# Patient Record
Sex: Female | Born: 1960
Health system: Southern US, Community
[De-identification: ages and names within clinical notes are randomized; demographics above are authoritative.]

## PROBLEM LIST (undated history)

## (undated) DIAGNOSIS — E785 Hyperlipidemia, unspecified: Secondary | ICD-10-CM

## (undated) DIAGNOSIS — K8020 Calculus of gallbladder without cholecystitis without obstruction: Secondary | ICD-10-CM

## (undated) DIAGNOSIS — L659 Nonscarring hair loss, unspecified: Secondary | ICD-10-CM

## (undated) DIAGNOSIS — E039 Hypothyroidism, unspecified: Secondary | ICD-10-CM

## (undated) DIAGNOSIS — F17200 Nicotine dependence, unspecified, uncomplicated: Secondary | ICD-10-CM

## (undated) DIAGNOSIS — G4700 Insomnia, unspecified: Secondary | ICD-10-CM

## (undated) DIAGNOSIS — M25559 Pain in unspecified hip: Secondary | ICD-10-CM

## (undated) DIAGNOSIS — I739 Peripheral vascular disease, unspecified: Secondary | ICD-10-CM

## (undated) DIAGNOSIS — R079 Chest pain, unspecified: Secondary | ICD-10-CM

## (undated) DIAGNOSIS — E079 Disorder of thyroid, unspecified: Secondary | ICD-10-CM

## (undated) DIAGNOSIS — M545 Low back pain, unspecified: Secondary | ICD-10-CM

## (undated) DIAGNOSIS — J301 Allergic rhinitis due to pollen: Secondary | ICD-10-CM

## (undated) DIAGNOSIS — T50905A Adverse effect of unspecified drugs, medicaments and biological substances, initial encounter: Secondary | ICD-10-CM

## (undated) DIAGNOSIS — T7840XA Allergy, unspecified, initial encounter: Secondary | ICD-10-CM

## (undated) DIAGNOSIS — E669 Obesity, unspecified: Secondary | ICD-10-CM

## (undated) DIAGNOSIS — E876 Hypokalemia: Secondary | ICD-10-CM

## (undated) DIAGNOSIS — E069 Thyroiditis, unspecified: Secondary | ICD-10-CM

## (undated) HISTORY — DX: Calculus of gallbladder without cholecystitis without obstruction: K80.20

## (undated) HISTORY — DX: Insomnia, unspecified: G47.00

## (undated) HISTORY — DX: Allergic rhinitis due to pollen: J30.1

## (undated) HISTORY — DX: Peripheral vascular disease, unspecified: I73.9

## (undated) HISTORY — DX: Hypokalemia: E87.6

## (undated) HISTORY — DX: Allergy, unspecified, initial encounter: T78.40XA

## (undated) HISTORY — DX: Low back pain, unspecified: M54.50

## (undated) HISTORY — DX: Hypothyroidism, unspecified: E03.9

## (undated) HISTORY — DX: Obesity, unspecified: E66.9

## (undated) HISTORY — DX: Chest pain, unspecified: R07.9

## (undated) HISTORY — DX: Adverse effect of unspecified drugs, medicaments and biological substances, initial encounter: T50.905A

## (undated) HISTORY — DX: Thyroiditis, unspecified: E06.9

## (undated) HISTORY — DX: Nonscarring hair loss, unspecified: L65.9

## (undated) HISTORY — DX: Hyperlipidemia, unspecified: E78.5

## (undated) HISTORY — PX: REVISION TOTAL HIP ARTHROPLASTY: SHX766

## (undated) HISTORY — DX: Nicotine dependence, unspecified, uncomplicated: F17.200

## (undated) HISTORY — DX: Pain in unspecified hip: M25.559

## (undated) HISTORY — PX: TUBAL LIGATION: SHX77

---
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
## 2004-11-04 ENCOUNTER — Ambulatory Visit (HOSPITAL_COMMUNITY): Admission: RE | Admit: 2004-11-04 | Discharge: 2004-11-04 | Payer: Self-pay | Admitting: Pulmonary Disease

## 2005-05-02 ENCOUNTER — Emergency Department (HOSPITAL_COMMUNITY): Admission: EM | Admit: 2005-05-02 | Discharge: 2005-05-02 | Payer: Self-pay | Admitting: Emergency Medicine

## 2008-03-06 ENCOUNTER — Ambulatory Visit: Payer: Self-pay | Admitting: Orthopedic Surgery

## 2008-03-06 DIAGNOSIS — M545 Low back pain, unspecified: Secondary | ICD-10-CM | POA: Insufficient documentation

## 2008-03-06 DIAGNOSIS — IMO0002 Reserved for concepts with insufficient information to code with codable children: Secondary | ICD-10-CM

## 2008-03-06 DIAGNOSIS — M169 Osteoarthritis of hip, unspecified: Secondary | ICD-10-CM

## 2008-03-06 DIAGNOSIS — M1610 Unilateral primary osteoarthritis, unspecified hip: Secondary | ICD-10-CM | POA: Insufficient documentation

## 2008-03-08 ENCOUNTER — Encounter: Payer: Self-pay | Admitting: Orthopedic Surgery

## 2008-03-26 ENCOUNTER — Telehealth: Payer: Self-pay | Admitting: Orthopedic Surgery

## 2008-04-09 ENCOUNTER — Telehealth: Payer: Self-pay | Admitting: Orthopedic Surgery

## 2008-04-12 ENCOUNTER — Encounter: Payer: Self-pay | Admitting: Orthopedic Surgery

## 2008-05-06 ENCOUNTER — Encounter: Payer: Self-pay | Admitting: Orthopedic Surgery

## 2014-03-27 ENCOUNTER — Other Ambulatory Visit (HOSPITAL_COMMUNITY): Payer: Self-pay | Admitting: Pulmonary Disease

## 2014-03-27 DIAGNOSIS — Z139 Encounter for screening, unspecified: Secondary | ICD-10-CM

## 2014-03-27 DIAGNOSIS — E039 Hypothyroidism, unspecified: Secondary | ICD-10-CM | POA: Diagnosis not present

## 2014-03-27 DIAGNOSIS — M1990 Unspecified osteoarthritis, unspecified site: Secondary | ICD-10-CM | POA: Diagnosis not present

## 2014-04-03 ENCOUNTER — Ambulatory Visit (HOSPITAL_COMMUNITY)
Admission: RE | Admit: 2014-04-03 | Discharge: 2014-04-03 | Disposition: A | Discharge status: Home / Self Care | Payer: Medicare Other | Source: Ambulatory Visit | Attending: Pulmonary Disease | Admitting: Pulmonary Disease

## 2014-04-03 ENCOUNTER — Other Ambulatory Visit (HOSPITAL_COMMUNITY): Payer: Self-pay | Admitting: Pulmonary Disease

## 2014-04-03 DIAGNOSIS — Z1231 Encounter for screening mammogram for malignant neoplasm of breast: Secondary | ICD-10-CM | POA: Insufficient documentation

## 2014-04-03 DIAGNOSIS — Z139 Encounter for screening, unspecified: Secondary | ICD-10-CM

## 2014-04-03 LAB — HM MAMMOGRAPHY

## 2014-09-26 DIAGNOSIS — J309 Allergic rhinitis, unspecified: Secondary | ICD-10-CM | POA: Diagnosis not present

## 2014-09-26 DIAGNOSIS — E039 Hypothyroidism, unspecified: Secondary | ICD-10-CM | POA: Diagnosis not present

## 2015-03-25 DIAGNOSIS — E039 Hypothyroidism, unspecified: Secondary | ICD-10-CM | POA: Diagnosis not present

## 2015-03-25 DIAGNOSIS — L659 Nonscarring hair loss, unspecified: Secondary | ICD-10-CM | POA: Diagnosis not present

## 2015-03-25 DIAGNOSIS — F17200 Nicotine dependence, unspecified, uncomplicated: Secondary | ICD-10-CM | POA: Diagnosis not present

## 2015-03-25 LAB — HM HEPATITIS C SCREENING LAB: HM Hepatitis Screen: NEGATIVE

## 2015-09-22 DIAGNOSIS — E039 Hypothyroidism, unspecified: Secondary | ICD-10-CM | POA: Diagnosis not present

## 2015-09-22 DIAGNOSIS — F17200 Nicotine dependence, unspecified, uncomplicated: Secondary | ICD-10-CM | POA: Diagnosis not present

## 2015-11-27 DIAGNOSIS — E039 Hypothyroidism, unspecified: Secondary | ICD-10-CM | POA: Diagnosis not present

## 2015-11-27 DIAGNOSIS — F17200 Nicotine dependence, unspecified, uncomplicated: Secondary | ICD-10-CM | POA: Diagnosis not present

## 2016-03-29 DIAGNOSIS — Z0000 Encounter for general adult medical examination without abnormal findings: Secondary | ICD-10-CM | POA: Diagnosis not present

## 2016-03-29 DIAGNOSIS — Z23 Encounter for immunization: Secondary | ICD-10-CM | POA: Diagnosis not present

## 2016-04-01 DIAGNOSIS — Z0000 Encounter for general adult medical examination without abnormal findings: Secondary | ICD-10-CM | POA: Diagnosis not present

## 2016-04-01 DIAGNOSIS — E039 Hypothyroidism, unspecified: Secondary | ICD-10-CM | POA: Diagnosis not present

## 2016-04-01 DIAGNOSIS — F17200 Nicotine dependence, unspecified, uncomplicated: Secondary | ICD-10-CM | POA: Diagnosis not present

## 2016-04-01 DIAGNOSIS — L659 Nonscarring hair loss, unspecified: Secondary | ICD-10-CM | POA: Diagnosis not present

## 2016-04-05 LAB — FECAL OCCULT BLOOD, GUAIAC: Fecal Occult Blood: NEGATIVE

## 2016-04-06 DIAGNOSIS — Z1211 Encounter for screening for malignant neoplasm of colon: Secondary | ICD-10-CM | POA: Diagnosis not present

## 2016-09-22 DIAGNOSIS — E785 Hyperlipidemia, unspecified: Secondary | ICD-10-CM | POA: Diagnosis not present

## 2016-09-22 DIAGNOSIS — E039 Hypothyroidism, unspecified: Secondary | ICD-10-CM | POA: Diagnosis not present

## 2016-09-22 DIAGNOSIS — E669 Obesity, unspecified: Secondary | ICD-10-CM | POA: Diagnosis not present

## 2016-09-22 DIAGNOSIS — F17200 Nicotine dependence, unspecified, uncomplicated: Secondary | ICD-10-CM | POA: Diagnosis not present

## 2017-01-24 DIAGNOSIS — E785 Hyperlipidemia, unspecified: Secondary | ICD-10-CM | POA: Diagnosis not present

## 2017-01-24 DIAGNOSIS — E039 Hypothyroidism, unspecified: Secondary | ICD-10-CM | POA: Diagnosis not present

## 2017-01-24 DIAGNOSIS — F17200 Nicotine dependence, unspecified, uncomplicated: Secondary | ICD-10-CM | POA: Diagnosis not present

## 2017-01-24 DIAGNOSIS — G4700 Insomnia, unspecified: Secondary | ICD-10-CM | POA: Diagnosis not present

## 2017-04-14 ENCOUNTER — Other Ambulatory Visit: Payer: Self-pay

## 2017-04-14 ENCOUNTER — Emergency Department (HOSPITAL_COMMUNITY): Payer: Medicare HMO

## 2017-04-14 ENCOUNTER — Emergency Department (HOSPITAL_COMMUNITY)
Admission: EM | Admit: 2017-04-14 | Discharge: 2017-04-15 | Disposition: A | Discharge status: Home / Self Care | Payer: Medicare HMO | Attending: Emergency Medicine | Admitting: Emergency Medicine

## 2017-04-14 ENCOUNTER — Encounter (HOSPITAL_COMMUNITY): Payer: Self-pay | Admitting: *Deleted

## 2017-04-14 DIAGNOSIS — E876 Hypokalemia: Secondary | ICD-10-CM | POA: Diagnosis not present

## 2017-04-14 DIAGNOSIS — Z87891 Personal history of nicotine dependence: Secondary | ICD-10-CM | POA: Insufficient documentation

## 2017-04-14 DIAGNOSIS — R0789 Other chest pain: Secondary | ICD-10-CM | POA: Diagnosis not present

## 2017-04-14 DIAGNOSIS — E785 Hyperlipidemia, unspecified: Secondary | ICD-10-CM | POA: Insufficient documentation

## 2017-04-14 DIAGNOSIS — R079 Chest pain, unspecified: Secondary | ICD-10-CM | POA: Diagnosis present

## 2017-04-14 DIAGNOSIS — R001 Bradycardia, unspecified: Secondary | ICD-10-CM | POA: Diagnosis not present

## 2017-04-14 DIAGNOSIS — J4521 Mild intermittent asthma with (acute) exacerbation: Secondary | ICD-10-CM | POA: Insufficient documentation

## 2017-04-14 DIAGNOSIS — R0602 Shortness of breath: Secondary | ICD-10-CM | POA: Diagnosis not present

## 2017-04-14 HISTORY — DX: Hyperlipidemia, unspecified: E78.5

## 2017-04-14 HISTORY — DX: Disorder of thyroid, unspecified: E07.9

## 2017-04-14 LAB — MAGNESIUM: MAGNESIUM: 2 mg/dL (ref 1.7–2.4)

## 2017-04-14 LAB — BASIC METABOLIC PANEL
ANION GAP: 12 (ref 5–15)
BUN: 7 mg/dL (ref 6–20)
CALCIUM: 9.2 mg/dL (ref 8.9–10.3)
CO2: 22 mmol/L (ref 22–32)
CREATININE: 1.07 mg/dL — AB (ref 0.44–1.00)
Chloride: 104 mmol/L (ref 101–111)
GFR calc Af Amer: >60 mL/min (ref >60)
GFR, EST NON AFRICAN AMERICAN: 57 mL/min — AB (ref >60)
GLUCOSE: 89 mg/dL (ref 65–99)
Potassium: 3.1 mmol/L — ABNORMAL LOW (ref 3.5–5.1)
Sodium: 138 mmol/L (ref 135–145)

## 2017-04-14 LAB — CBC
HCT: 40.7 % (ref 36.0–46.0)
HEMOGLOBIN: 13.6 g/dL (ref 12.0–15.0)
MCH: 31.3 pg (ref 26.0–34.0)
MCHC: 33.4 g/dL (ref 30.0–36.0)
MCV: 93.8 fL (ref 78.0–100.0)
PLATELETS: 218 10*3/uL (ref 150–400)
RBC: 4.34 MIL/uL (ref 3.87–5.11)
RDW: 15.1 % (ref 11.5–15.5)
WBC: 7.6 10*3/uL (ref 4.0–10.5)

## 2017-04-14 LAB — TROPONIN I

## 2017-04-14 MED ORDER — POTASSIUM CHLORIDE CRYS ER 20 MEQ PO TBCR
40.0000 meq | EXTENDED_RELEASE_TABLET | Freq: Once | ORAL | Status: AC
Start: 2017-04-14 — End: 2017-04-14
  Administered 2017-04-14: 40 meq via ORAL
  Filled 2017-04-14: qty 2

## 2017-04-14 MED ORDER — IPRATROPIUM-ALBUTEROL 0.5-2.5 (3) MG/3ML IN SOLN
3.0000 mL | Freq: Once | RESPIRATORY_TRACT | Status: AC
  Administered 2017-04-14: 3 mL via RESPIRATORY_TRACT
  Filled 2017-04-14: qty 3

## 2017-04-14 MED ORDER — METHYLPREDNISOLONE SODIUM SUCC 125 MG IJ SOLR
125.0000 mg | Freq: Once | INTRAMUSCULAR | Status: AC
  Administered 2017-04-14: 125 mg via INTRAVENOUS
  Filled 2017-04-14: qty 2

## 2017-04-14 MED ORDER — KETOROLAC TROMETHAMINE 30 MG/ML IJ SOLN
30.0000 mg | Freq: Once | INTRAMUSCULAR | Status: AC
  Administered 2017-04-14: 30 mg via INTRAVENOUS
  Filled 2017-04-14: qty 1

## 2017-04-14 NOTE — ED Provider Notes (Signed)
[facility] DEPT Provider Note   CSN: [id number] Arrival date & time: 04/14/17  1634     History   Chief Complaint Chief Complaint  Patient presents with  . Chest Pain    HPI Misty Parker is a 56 y.o. female.  Pt presents to the ED today with chest pain and sob.  Pt said sob started a few days ago.  She initially thought it may be allergies, but it did not improve.  The cp started today.  She describes it as feeling tight.  Pain radiates up to her left neck.  Pt denies f/c.      Past Medical History:  Diagnosis Date  . Hyperlipidemia   . Thyroid disease     Patient Active Problem List   Diagnosis Date Noted  . HIP, ARTHRITIS, DEGEN./OSTEO 03/06/2008  . DISC DEGENERATION 03/06/2008  . LOW BACK PAIN 03/06/2008    Past Surgical History:  Procedure Laterality Date  . REVISION TOTAL HIP ARTHROPLASTY      OB History    No data available       Home Medications    Prior to Admission medications   Medication Sig Start Date End Date Taking? Authorizing Provider  potassium chloride (K-DUR) 10 MEQ tablet Take 1 tablet (10 mEq total) by mouth daily. 04/15/17   Jacalyn Lefevre, MD  predniSONE (STERAPRED UNI-PAK 21 TAB) 10 MG (21) TBPK tablet Take 6 tabs by mouth daily  for 2 days, then 5 tabs for 2 days, then 4 tabs for 2 days, then 3 tabs for 2 days, 2 tabs for 2 days, then 1 tab by mouth daily for 2 days 04/15/17   Jacalyn Lefevre, MD    Family History No family history on file.  Social History Social History  Substance Use Topics  . Smoking status: Former Games developer  . Smokeless tobacco: Never Used  . Alcohol use No     Allergies   Patient has no known allergies.   Review of Systems Review of Systems  Respiratory: Positive for shortness of breath.   Cardiovascular: Positive for chest pain.  All other systems reviewed and are negative.    Physical Exam Updated Vital Signs BP (!) 125/57   Pulse (!) 42   Temp 98.2 F (36.8 C) (Oral)   Resp 10    Ht  (1.651 m)   Wt 86.2 kg (190 lb)   SpO2 99%   BMI 31.62 kg/m   Physical Exam  Constitutional: She is oriented to person, place, and time. She appears well-developed and well-nourished.  HENT:  Head: Normocephalic and atraumatic.  Right Ear: External ear normal.  Left Ear: External ear normal.  Nose: Nose normal.  Mouth/Throat: Oropharynx is clear and moist.  Eyes: Pupils are equal, round, and reactive to light. Conjunctivae and EOM are normal.  Neck: Normal range of motion. Neck supple.  Cardiovascular: Normal rate, regular rhythm, normal heart sounds and intact distal pulses.   Pulmonary/Chest: She has wheezes.  Abdominal: Soft. Bowel sounds are normal.  Musculoskeletal: Normal range of motion.  Neurological: She is alert and oriented to person, place, and time.  Skin: Skin is warm and dry.  Psychiatric: She has a normal mood and affect. Her behavior is normal. Thought content normal.  Nursing note and vitals reviewed.    ED Treatments / Results  Labs (all labs ordered are listed, but only abnormal results are displayed) Labs Reviewed  BASIC METABOLIC PANEL - Abnormal; Notable for the following:  Result Value   Potassium 3.1 (*)    Creatinine, Ser 1.07 (*)    GFR calc non Af Amer 57 (*)    All other components within normal limits  CBC  TROPONIN I  MAGNESIUM  I-STAT TROPONIN, ED    EKG  EKG Interpretation  Date/Time:  Thursday April 14 2017 16:41:39 EDT Ventricular Rate:  55 PR Interval:  190 QRS Duration: 82 QT Interval:  406 QTC Calculation: 388 R Axis:   46 Text Interpretation:  Sinus bradycardia ST & T wave abnormality, consider anterolateral ischemia Abnormal ECG No old tracing to compare Confirmed by [place], Doreatha Martin [phone]) on 04/14/2017 5:45:16 PM       Radiology Dg Chest 2 View  Result Date: 04/14/2017 CLINICAL DATA:  Tightness in chest started today-- SOB x 3 days worsening Former smoker EXAM: CHEST  2 VIEW COMPARISON:  None.  FINDINGS: The heart size and mediastinal contours are within normal limits. Both lungs are clear. The visualized skeletal structures are unremarkable. IMPRESSION: No active cardiopulmonary disease. Electronically Signed   By: Norva Pavlov M.D.   On: 04/14/2017 17:13    Procedures Procedures (including critical care time)  Medications Ordered in ED Medications  albuterol (PROVENTIL HFA;VENTOLIN HFA) 108 (90 Base) MCG/ACT inhaler 1-2 puff (not administered)  AEROCHAMBER PLUS FLO-VU MEDIUM MISC 1 each (not administered)  ipratropium-albuterol (DUONEB) 0.5-2.5 (3) MG/3ML nebulizer solution 3 mL (3 mLs Nebulization Given 04/14/17 2202)  methylPREDNISolone sodium succinate (SOLU-MEDROL) 125 mg/2 mL injection 125 mg (125 mg Intravenous Given 04/14/17 2252)  potassium chloride SA (K-DUR,KLOR-CON) CR tablet 40 mEq (40 mEq Oral Given 04/14/17 2252)  ketorolac (TORADOL) 30 MG/ML injection 30 mg (30 mg Intravenous Given 04/14/17 2252)     Initial Impression / Assessment and Plan / ED Course  I have reviewed the triage vital signs and the nursing notes.  Pertinent labs & imaging results that were available during my care of the patient were reviewed by me and considered in my medical decision making (see chart for details).    Pt is feeling better.  She is given potassium here.  She is given an inhaler with spacer for home.  She is bradycardic here, but does not look symptomatic.  She knows to return if worse.  F/u with pcp.   Final Clinical Impressions(s) / ED Diagnoses   Final diagnoses:  Atypical chest pain  Mild intermittent reactive airway disease with acute exacerbation  Hypokalemia    New Prescriptions New Prescriptions   POTASSIUM CHLORIDE (K-DUR) 10 MEQ TABLET    Take 1 tablet (10 mEq total) by mouth daily.   PREDNISONE (STERAPRED UNI-PAK 21 TAB) 10 MG (21) TBPK TABLET    Take 6 tabs by mouth daily  for 2 days, then 5 tabs for 2 days, then 4 tabs for 2 days, then 3 tabs for 2 days, 2  tabs for 2 days, then 1 tab by mouth daily for 2 days     Jacalyn Lefevre, MD 04/15/17 0013

## 2017-04-14 NOTE — ED Triage Notes (Signed)
Pt come in with chest pain starting 4 days ago. Pt states she has had worsening chest pain and shortness of breath. Pt has tingling feeling around her mouth. Pt is alert and oriented. NAD noted.

## 2017-04-14 NOTE — ED Notes (Signed)
Patient heart rate on monitor was 39, reported to nurse RN Micheal. I did a repeat EKG on patient and gave to nurse. Patient states that she feels funny.States that she had some numbness in arm and tingling around her mouth.

## 2017-04-15 LAB — POCT I-STAT TROPONIN I: TROPONIN I, POC: 0 ng/mL (ref 0.00–0.08)

## 2017-04-15 MED ORDER — POTASSIUM CHLORIDE ER 10 MEQ PO TBCR: 10.0000 meq | EXTENDED_RELEASE_TABLET | Freq: Every day | ORAL | 0 refills | Status: DC

## 2017-04-15 MED ORDER — AEROCHAMBER PLUS FLO-VU MEDIUM MISC
1.0000 | Freq: Once | Status: AC
  Administered 2017-04-15: 1
  Filled 2017-04-15 (×2): qty 1

## 2017-04-15 MED ORDER — PREDNISONE 10 MG (21) PO TBPK: ORAL_TABLET | ORAL | 0 refills | Status: DC

## 2017-04-15 MED ORDER — ALBUTEROL SULFATE HFA 108 (90 BASE) MCG/ACT IN AERS
1.0000 | INHALATION_SPRAY | RESPIRATORY_TRACT | Status: DC | PRN
  Administered 2017-04-15: 2 via RESPIRATORY_TRACT
  Filled 2017-04-15 (×2): qty 6.7

## 2017-04-19 DIAGNOSIS — E039 Hypothyroidism, unspecified: Secondary | ICD-10-CM | POA: Diagnosis not present

## 2017-04-19 DIAGNOSIS — E876 Hypokalemia: Secondary | ICD-10-CM | POA: Diagnosis not present

## 2017-04-19 DIAGNOSIS — R079 Chest pain, unspecified: Secondary | ICD-10-CM | POA: Diagnosis not present

## 2017-04-19 DIAGNOSIS — F17200 Nicotine dependence, unspecified, uncomplicated: Secondary | ICD-10-CM | POA: Diagnosis not present

## 2017-04-20 ENCOUNTER — Other Ambulatory Visit (HOSPITAL_COMMUNITY): Payer: Self-pay | Admitting: Pulmonary Disease

## 2017-04-20 DIAGNOSIS — R109 Unspecified abdominal pain: Secondary | ICD-10-CM

## 2017-04-27 ENCOUNTER — Ambulatory Visit (HOSPITAL_COMMUNITY)
Admission: RE | Admit: 2017-04-27 | Discharge: 2017-04-27 | Disposition: A | Discharge status: Home / Self Care | Payer: Medicare HMO | Source: Ambulatory Visit | Attending: Pulmonary Disease | Admitting: Pulmonary Disease

## 2017-04-27 DIAGNOSIS — K8020 Calculus of gallbladder without cholecystitis without obstruction: Secondary | ICD-10-CM | POA: Insufficient documentation

## 2017-04-27 DIAGNOSIS — R109 Unspecified abdominal pain: Secondary | ICD-10-CM | POA: Diagnosis not present

## 2017-04-27 DIAGNOSIS — I700 Atherosclerosis of aorta: Secondary | ICD-10-CM | POA: Insufficient documentation

## 2017-04-27 DIAGNOSIS — K760 Fatty (change of) liver, not elsewhere classified: Secondary | ICD-10-CM | POA: Diagnosis not present

## 2017-04-28 DIAGNOSIS — E039 Hypothyroidism, unspecified: Secondary | ICD-10-CM | POA: Diagnosis not present

## 2017-04-28 DIAGNOSIS — Z23 Encounter for immunization: Secondary | ICD-10-CM | POA: Diagnosis not present

## 2017-04-28 DIAGNOSIS — I739 Peripheral vascular disease, unspecified: Secondary | ICD-10-CM | POA: Diagnosis not present

## 2017-04-28 DIAGNOSIS — E785 Hyperlipidemia, unspecified: Secondary | ICD-10-CM | POA: Diagnosis not present

## 2017-04-28 DIAGNOSIS — F17200 Nicotine dependence, unspecified, uncomplicated: Secondary | ICD-10-CM | POA: Diagnosis not present

## 2017-05-12 ENCOUNTER — Encounter: Payer: Self-pay | Admitting: General Surgery

## 2017-05-12 ENCOUNTER — Ambulatory Visit (INDEPENDENT_AMBULATORY_CARE_PROVIDER_SITE_OTHER): Payer: Medicare HMO | Admitting: General Surgery

## 2017-05-12 VITALS — BP 138/78 | HR 75 | Temp 99.1°F | Resp 18 | Ht 65.0 in | Wt 186.0 lb

## 2017-05-12 DIAGNOSIS — K8020 Calculus of gallbladder without cholecystitis without obstruction: Secondary | ICD-10-CM | POA: Diagnosis not present

## 2017-05-12 NOTE — H&P (Signed)
Misty Parker; [id number]; May 09, 1961   HPI Patient is a 56 year old white female who was referred to my care by Dr. Juanetta GoslingHawkins for evaluation and treatment of gallstones.  Patient states that she has had right upper quadrant abdominal pain with nausea and bloating over the past 4 weeks.  She has tried to control her symptoms with diet.  She denies any fever, chills, or jaundice.  She was found on ultrasound the gallbladder to have cholelithiasis.  She currently has 0 pain.  She does have nausea with fatty foods.  When she does have right upper quadrant abdominal pain, radiates around the right leg to the back. Past Medical History:  Diagnosis Date  . Hyperlipidemia   . Thyroid disease     Past Surgical History:  Procedure Laterality Date  . REVISION TOTAL HIP ARTHROPLASTY      No family history on file.  Current Outpatient Prescriptions on File Prior to Visit  Medication Sig Dispense Refill  . potassium chloride (K-DUR) 10 MEQ tablet Take 1 tablet (10 mEq total) by mouth daily. 7 tablet 0  . predniSONE (STERAPRED UNI-PAK 21 TAB) 10 MG (21) TBPK tablet Take 6 tabs by mouth daily  for 2 days, then 5 tabs for 2 days, then 4 tabs for 2 days, then 3 tabs for 2 days, 2 tabs for 2 days, then 1 tab by mouth daily for 2 days 42 tablet 0   No current facility-administered medications on file prior to visit.     No Known Allergies  History  Alcohol Use No    History  Smoking Status  . Former Smoker  Smokeless Tobacco  . Never Used    Review of Systems  Constitutional: Positive for malaise/fatigue.  HENT: Negative.   Eyes: Negative.   Respiratory: Negative.   Cardiovascular: Negative.   Gastrointestinal: Negative.   Genitourinary: Negative.   Musculoskeletal: Positive for back pain.  Skin: Negative.   Neurological: Negative.   Endo/Heme/Allergies: Negative.   Psychiatric/Behavioral: Negative.     Objective   Vitals:   05/12/17 0941  BP: 138/78  Pulse: 75  Resp: 18   Temp: 99.1 F (37.3 C)    Physical Exam  Constitutional: She is oriented to person, place, and time and well-developed, well-nourished, and in no distress.  HENT:  Head: Normocephalic and atraumatic.  Eyes: No scleral icterus.  Cardiovascular: Normal rate, regular rhythm and normal heart sounds.  Exam reveals no gallop and no friction rub.   No murmur heard. Pulmonary/Chest: Effort normal and breath sounds normal. No respiratory distress. She has no wheezes. She has no rales.  Abdominal: Soft. Bowel sounds are normal. She exhibits no distension. There is tenderness. There is no rebound and no guarding.  Slight tenderness in the right upper quadrant to palpation.  No rigidity noted.  Neurological: She is alert and oriented to person, place, and time.  Skin: Skin is warm and dry.  Vitals reviewed.  Ultrasound report reviewed. Assessment  Biliary colic, cholelithiasis Plan   Patient is scheduled for laparoscopic cholecystectomy on 05/20/2017.  The risks and benefits of the procedure including bleeding, infection, hepatobiliary injury, and the possibility of an open procedure were fully explained to the patient, who gave informed consent.

## 2017-05-12 NOTE — Progress Notes (Signed)
Misty Parker; [id number]; 06/05/1961   HPI Patient is a 56-year-old white female who was referred to my care by Dr. Hawkins for evaluation and treatment of gallstones.  Patient states that she has had right upper quadrant abdominal pain with nausea and bloating over the past 4 weeks.  She has tried to control her symptoms with diet.  She denies any fever, chills, or jaundice.  She was found on ultrasound the gallbladder to have cholelithiasis.  She currently has 0 pain.  She does have nausea with fatty foods.  When she does have right upper quadrant abdominal pain, radiates around the right leg to the back. Past Medical History:  Diagnosis Date  . Hyperlipidemia   . Thyroid disease     Past Surgical History:  Procedure Laterality Date  . REVISION TOTAL HIP ARTHROPLASTY      No family history on file.  Current Outpatient Prescriptions on File Prior to Visit  Medication Sig Dispense Refill  . potassium chloride (K-DUR) 10 MEQ tablet Take 1 tablet (10 mEq total) by mouth daily. 7 tablet 0  . predniSONE (STERAPRED UNI-PAK 21 TAB) 10 MG (21) TBPK tablet Take 6 tabs by mouth daily  for 2 days, then 5 tabs for 2 days, then 4 tabs for 2 days, then 3 tabs for 2 days, 2 tabs for 2 days, then 1 tab by mouth daily for 2 days 42 tablet 0   No current facility-administered medications on file prior to visit.     No Known Allergies  History  Alcohol Use No    History  Smoking Status  . Former Smoker  Smokeless Tobacco  . Never Used    Review of Systems  Constitutional: Positive for malaise/fatigue.  HENT: Negative.   Eyes: Negative.   Respiratory: Negative.   Cardiovascular: Negative.   Gastrointestinal: Negative.   Genitourinary: Negative.   Musculoskeletal: Positive for back pain.  Skin: Negative.   Neurological: Negative.   Endo/Heme/Allergies: Negative.   Psychiatric/Behavioral: Negative.     Objective   Vitals:   05/12/17 0941  BP: 138/78  Pulse: 75  Resp: 18   Temp: 99.1 F (37.3 C)    Physical Exam  Constitutional: She is oriented to person, place, and time and well-developed, well-nourished, and in no distress.  HENT:  Head: Normocephalic and atraumatic.  Eyes: No scleral icterus.  Cardiovascular: Normal rate, regular rhythm and normal heart sounds.  Exam reveals no gallop and no friction rub.   No murmur heard. Pulmonary/Chest: Effort normal and breath sounds normal. No respiratory distress. She has no wheezes. She has no rales.  Abdominal: Soft. Bowel sounds are normal. She exhibits no distension. There is tenderness. There is no rebound and no guarding.  Slight tenderness in the right upper quadrant to palpation.  No rigidity noted.  Neurological: She is alert and oriented to person, place, and time.  Skin: Skin is warm and dry.  Vitals reviewed.  Ultrasound report reviewed. Assessment  Biliary colic, cholelithiasis Plan   Patient is scheduled for laparoscopic cholecystectomy on 05/20/2017.  The risks and benefits of the procedure including bleeding, infection, hepatobiliary injury, and the possibility of an open procedure were fully explained to the patient, who gave informed consent.  

## 2017-05-12 NOTE — Patient Instructions (Signed)

## 2017-05-17 ENCOUNTER — Encounter (HOSPITAL_COMMUNITY)
Admission: RE | Admit: 2017-05-17 | Discharge: 2017-05-17 | Disposition: A | Discharge status: Home / Self Care | Payer: Medicare HMO | Source: Ambulatory Visit | Attending: General Surgery | Admitting: General Surgery

## 2017-05-17 ENCOUNTER — Encounter (HOSPITAL_COMMUNITY): Payer: Self-pay

## 2017-05-17 ENCOUNTER — Other Ambulatory Visit: Payer: Self-pay

## 2017-05-17 DIAGNOSIS — Z96649 Presence of unspecified artificial hip joint: Secondary | ICD-10-CM | POA: Diagnosis not present

## 2017-05-17 DIAGNOSIS — K8070 Calculus of gallbladder and bile duct without cholecystitis without obstruction: Secondary | ICD-10-CM | POA: Diagnosis present

## 2017-05-17 DIAGNOSIS — E079 Disorder of thyroid, unspecified: Secondary | ICD-10-CM | POA: Diagnosis not present

## 2017-05-17 DIAGNOSIS — E785 Hyperlipidemia, unspecified: Secondary | ICD-10-CM | POA: Diagnosis not present

## 2017-05-17 DIAGNOSIS — Z87891 Personal history of nicotine dependence: Secondary | ICD-10-CM | POA: Diagnosis not present

## 2017-05-17 DIAGNOSIS — K8064 Calculus of gallbladder and bile duct with chronic cholecystitis without obstruction: Secondary | ICD-10-CM | POA: Diagnosis not present

## 2017-05-17 HISTORY — DX: Hypothyroidism, unspecified: E03.9

## 2017-05-17 LAB — COMPREHENSIVE METABOLIC PANEL
ALBUMIN: 3.9 g/dL (ref 3.5–5.0)
ALK PHOS: 73 U/L (ref 38–126)
ALT: 20 U/L (ref 14–54)
ANION GAP: 13 (ref 5–15)
AST: 25 U/L (ref 15–41)
BILIRUBIN TOTAL: 0.7 mg/dL (ref 0.3–1.2)
BUN: 7 mg/dL (ref 6–20)
CALCIUM: 9.3 mg/dL (ref 8.9–10.3)
CO2: 23 mmol/L (ref 22–32)
Chloride: 103 mmol/L (ref 101–111)
Creatinine, Ser: 0.85 mg/dL (ref 0.44–1.00)
Glucose, Bld: 102 mg/dL — ABNORMAL HIGH (ref 65–99)
POTASSIUM: 4 mmol/L (ref 3.5–5.1)
Sodium: 139 mmol/L (ref 135–145)
Total Protein: 7.3 g/dL (ref 6.5–8.1)

## 2017-05-17 LAB — CBC WITH DIFFERENTIAL/PLATELET
BASOS ABS: 0 10*3/uL (ref 0.0–0.1)
BASOS PCT: 0 %
Eosinophils Absolute: 0.1 10*3/uL (ref 0.0–0.7)
Eosinophils Relative: 2 %
HEMATOCRIT: 40.5 % (ref 36.0–46.0)
HEMOGLOBIN: 12.9 g/dL (ref 12.0–15.0)
LYMPHS PCT: 31 %
Lymphs Abs: 2.8 10*3/uL (ref 0.7–4.0)
MCH: 30.6 pg (ref 26.0–34.0)
MCHC: 31.9 g/dL (ref 30.0–36.0)
MCV: 96.2 fL (ref 78.0–100.0)
MONO ABS: 0.8 10*3/uL (ref 0.1–1.0)
Monocytes Relative: 9 %
NEUTROS ABS: 5.1 10*3/uL (ref 1.7–7.7)
NEUTROS PCT: 58 %
Platelets: 349 10*3/uL (ref 150–400)
RBC: 4.21 MIL/uL (ref 3.87–5.11)
RDW: 16 % — AB (ref 11.5–15.5)
WBC: 8.8 10*3/uL (ref 4.0–10.5)

## 2017-05-17 NOTE — Patient Instructions (Signed)
Misty Parker  05/17/2017     @PREFPERIOPPHARMACY @   Your procedure is scheduled on  05/20/2017   [facility]  [facility]   A.M.  Call this number if you have problems the morning of surgery:  [phone]   Remember:  Do not eat food or drink liquids after midnight.  Take these medicines the morning of surgery with A SIP OF WATER None   Do not wear jewelry, make-up or nail polish.  Do not wear lotions, powders, or perfumes, or deoderant.  Do not shave 48 hours prior to surgery.  Men may shave face and neck.  Do not bring valuables to the hospital.  [facility] is not responsible for any belongings or valuables.  Contacts, dentures or bridgework may not be worn into surgery.  Leave your suitcase in the car.  After surgery it may be brought to your room.  For patients admitted to the hospital, discharge time will be determined by your treatment team.  Patients discharged the day of surgery will not be allowed to drive home.   Name and phone number of your driver:   family Special instructions:  None  Please read over the following fact sheets that you were given. Anesthesia Post-op Instructions and Care and Recovery After Surgery       Laparoscopic Cholecystectomy Laparoscopic cholecystectomy is surgery to remove the gallbladder. The gallbladder is a pear-shaped organ that lies beneath the liver on the right side of the body. The gallbladder stores bile, which is a fluid that helps the body to digest fats. Cholecystectomy is often done for inflammation of the gallbladder (cholecystitis). This condition is usually caused by a buildup of gallstones (cholelithiasis) in the gallbladder. Gallstones can block the flow of bile, which can result in inflammation and pain. In severe cases, emergency surgery may be required. This procedure is done though small incisions in your abdomen (laparoscopic surgery). A thin scope with a camera (laparoscope) is inserted  through one incision. Thin surgical instruments are inserted through the other incisions. In some cases, a laparoscopic procedure may be turned into a type of surgery that is done through a larger incision (open surgery). Tell a health care provider about:  Any allergies you have.  All medicines you are taking, including vitamins, herbs, eye drops, creams, and over-the-counter medicines.  Any problems you or family members have had with anesthetic medicines.  Any blood disorders you have.  Any surgeries you have had.  Any medical conditions you have.  Whether you are pregnant or may be pregnant. What are the risks? Generally, this is a safe procedure. However, problems may occur, including:  Infection.  Bleeding.  Allergic reactions to medicines.  Damage to other structures or organs.  A stone remaining in the common bile duct. The common bile duct carries bile from the gallbladder into the small intestine.  A bile leak from the cyst duct that is clipped when your gallbladder is removed.  What happens before the procedure? Staying hydrated Follow instructions from your health care provider about hydration, which may include:  Up to 2 hours before the procedure - you may continue to drink clear liquids, such as water, clear fruit juice, black coffee, and plain tea.  Eating and drinking restrictions Follow instructions from your health care provider about eating and drinking, which may include:  8 hours before the procedure - stop eating heavy meals or foods such as meat,  fried foods, or fatty foods.  6 hours before the procedure - stop eating light meals or foods, such as toast or cereal.  6 hours before the procedure - stop drinking milk or drinks that contain milk.  2 hours before the procedure - stop drinking clear liquids.  Medicines  Ask your health care provider about: ? Changing or stopping your regular medicines. This is especially important if you are taking  diabetes medicines or blood thinners. ? Taking medicines such as aspirin and ibuprofen. These medicines can thin your blood. Do not take these medicines before your procedure if your health care provider instructs you not to.  You may be given antibiotic medicine to help prevent infection. General instructions  Let your health care provider know if you develop a cold or an infection before surgery.  Plan to have someone take you home from the hospital or clinic.  Ask your health care provider how your surgical site will be marked or identified. What happens during the procedure?  To reduce your risk of infection: ? Your health care team will wash or sanitize their hands. ? Your skin will be washed with soap. ? Hair may be removed from the surgical area.  An IV tube may be inserted into one of your veins.  You will be given one or more of the following: ? A medicine to help you relax (sedative). ? A medicine to make you fall asleep (general anesthetic).  A breathing tube will be placed in your mouth.  Your surgeon will make several small cuts (incisions) in your abdomen.  The laparoscope will be inserted through one of the small incisions. The camera on the laparoscope will send images to a TV screen (monitor) in the operating room. This lets your surgeon see inside your abdomen.  Air-like gas will be pumped into your abdomen. This will expand your abdomen to give the surgeon more room to perform the surgery.  Other tools that are needed for the procedure will be inserted through the other incisions. The gallbladder will be removed through one of the incisions.  Your common bile duct may be examined. If stones are found in the common bile duct, they may be removed.  After your gallbladder has been removed, the incisions will be closed with stitches (sutures), staples, or skin glue.  Your incisions may be covered with a bandage (dressing). The procedure may vary among health care  providers and hospitals. What happens after the procedure?  Your blood pressure, heart rate, breathing rate, and blood oxygen level will be monitored until the medicines you were given have worn off.  You will be given medicines as needed to control your pain.  Do not drive for 24 hours if you were given a sedative. This information is not intended to replace advice given to you by your health care provider. Make sure you discuss any questions you have with your health care provider. Document Released: 06/28/2005 Document Revised: 01/18/2016 Document Reviewed: 12/15/2015 Elsevier Interactive Patient Education  2018 [employer].  Laparoscopic Cholecystectomy, Care After This sheet gives you information about how to care for yourself after your procedure. Your health care provider may also give you more specific instructions. If you have problems or questions, contact your health care provider. What can I expect after the procedure? After the procedure, it is common to have:  Pain at your incision sites. You will be given medicines to control this pain.  Mild nausea or vomiting.  Bloating and possible shoulder  pain from the air-like gas that was used during the procedure.  Follow these instructions at home: Incision care   Follow instructions from your health care provider about how to take care of your incisions. Make sure you: ? Wash your hands with soap and water before you change your bandage (dressing). If soap and water are not available, use hand sanitizer. ? Change your dressing as told by your health care provider. ? Leave stitches (sutures), skin glue, or adhesive strips in place. These skin closures may need to be in place for 2 weeks or longer. If adhesive strip edges start to loosen and curl up, you may trim the loose edges. Do not remove adhesive strips completely unless your health care provider tells you to do that.  Do not take baths, swim, or use a hot tub until your  health care provider approves. Ask your health care provider if you can take showers. You may only be allowed to take sponge baths for bathing.  Check your incision area every day for signs of infection. Check for: ? More redness, swelling, or pain. ? More fluid or blood. ? Warmth. ? Pus or a bad smell. Activity  Do not drive or use heavy machinery while taking prescription pain medicine.  Do not lift anything that is heavier than 10 lb (4.5 kg) until your health care provider approves.  Do not play contact sports until your health care provider approves.  Do not drive for 24 hours if you were given a medicine to help you relax (sedative).  Rest as needed. Do not return to work or school until your health care provider approves. General instructions  Take over-the-counter and prescription medicines only as told by your health care provider.  To prevent or treat constipation while you are taking prescription pain medicine, your health care provider may recommend that you: ? Drink enough fluid to keep your urine clear or pale yellow. ? Take over-the-counter or prescription medicines. ? Eat foods that are high in fiber, such as fresh fruits and vegetables, whole grains, and beans. ? Limit foods that are high in fat and processed sugars, such as fried and sweet foods. Contact a health care provider if:  You develop a rash.  You have more redness, swelling, or pain around your incisions.  You have more fluid or blood coming from your incisions.  Your incisions feel warm to the touch.  You have pus or a bad smell coming from your incisions.  You have a fever.  One or more of your incisions breaks open. Get help right away if:  You have trouble breathing.  You have chest pain.  You have increasing pain in your shoulders.  You faint or feel dizzy when you stand.  You have severe pain in your abdomen.  You have nausea or vomiting that lasts for more than one day.  You  have leg pain. This information is not intended to replace advice given to you by your health care provider. Make sure you discuss any questions you have with your health care provider. Document Released: 06/28/2005 Document Revised: 01/17/2016 Document Reviewed: 12/15/2015 Elsevier Interactive Patient Education  2017 Else[employer] Inc[employer]  General Anesthesia, Adult General anesthesia is the use of medicines to make a person "go to sleep" (be unconscious) for a medical procedure. General anesthesia is often recommended when a procedure:  Is long.  Requires you to be still or in an unusual position.  Is major and can cause you to lose blood.  Is impossible to do without general anesthesia.  The medicines used for general anesthesia are called general anesthetics. In addition to making you sleep, the medicines:  Prevent pain.  Control your blood pressure.  Relax your muscles.  Tell a health care provider about:  Any allergies you have.  All medicines you are taking, including vitamins, herbs, eye drops, creams, and over-the-counter medicines.  Any problems you or family members have had with anesthetic medicines.  Types of anesthetics you have had in the past.  Any bleeding disorders you have.  Any surgeries you have had.  Any medical conditions you have.  Any history of heart or lung conditions, such as heart failure, sleep apnea, or chronic obstructive pulmonary disease (COPD).  Whether you are pregnant or may be pregnant.  Whether you use tobacco, alcohol, marijuana, or street drugs.  Any history of Financial plannermilitary service.  Any history of depression or anxiety. What are the risks? Generally, this is a safe procedure. However, problems may occur, including:  Allergic reaction to anesthetics.  Lung and heart problems.  Inhaling food or liquids from your stomach into your lungs (aspiration).  Injury to nerves.  Waking up during your procedure and being unable to move  (rare).  Extreme agitation or a state of mental confusion (delirium) when you wake up from the anesthetic.  Air in the bloodstream, which can lead to stroke.  These problems are more likely to develop if you are having a major surgery or if you have an advanced medical condition. You can prevent some of these complications by answering all of your health care provider's questions thoroughly and by following all pre-procedure instructions. General anesthesia can cause side effects, including:  Nausea or vomiting  A sore throat from the breathing tube.  Feeling cold or shivery.  Feeling tired, washed out, or achy.  Sleepiness or drowsiness.  Confusion or agitation.  What happens before the procedure? Staying hydrated Follow instructions from your health care provider about hydration, which may include:  Up to 2 hours before the procedure - you may continue to drink clear liquids, such as water, clear fruit juice, black coffee, and plain tea.  Eating and drinking restrictions Follow instructions from your health care provider about eating and drinking, which may include:  8 hours before the procedure - stop eating heavy meals or foods such as meat, fried foods, or fatty foods.  6 hours before the procedure - stop eating light meals or foods, such as toast or cereal.  6 hours before the procedure - stop drinking milk or drinks that contain milk.  2 hours before the procedure - stop drinking clear liquids.  Medicines  Ask your health care provider about: ? Changing or stopping your regular medicines. This is especially important if you are taking diabetes medicines or blood thinners. ? Taking medicines such as aspirin and ibuprofen. These medicines can thin your blood. Do not take these medicines before your procedure if your health care provider instructs you not to. ? Taking new dietary supplements or medicines. Do not take these during the week before your procedure unless  your health care provider approves them.  If you are told to take a medicine or to continue taking a medicine on the day of the procedure, take the medicine with sips of water. General instructions   Ask if you will be going home the same day, the following day, or after a longer hospital stay. ? Plan to have someone take you home. ? Plan  to have someone stay with you for the first 24 hours after you leave the hospital or clinic.  For 3-6 weeks before the procedure, try not to use any tobacco products, such as cigarettes, chewing tobacco, and e-cigarettes.  You may brush your teeth on the morning of the procedure, but make sure to spit out the toothpaste. What happens during the procedure?  You will be given anesthetics through a mask and through an IV tube in one of your veins.  You may receive medicine to help you relax (sedative).  As soon as you are asleep, a breathing tube may be used to help you breathe.  An anesthesia specialist will stay with you throughout the procedure. He or she will help keep you comfortable and safe by continuing to give you medicines and adjusting the amount of medicine that you get. He or she will also watch your blood pressure, pulse, and oxygen levels to make sure that the anesthetics do not cause any problems.  If a breathing tube was used to help you breathe, it will be removed before you wake up. The procedure may vary among health care providers and hospitals. What happens after the procedure?  You will wake up, often slowly, after the procedure is complete, usually in a recovery area.  Your blood pressure, heart rate, breathing rate, and blood oxygen level will be monitored until the medicines you were given have worn off.  You may be given medicine to help you calm down if you feel anxious or agitated.  If you will be going home the same day, your health care provider may check to make sure you can stand, drink, and urinate.  Your health care  providers will treat your pain and side effects before you go home.  Do not drive for 24 hours if you received a sedative.  You may: ? Feel nauseous and vomit. ? Have a sore throat. ? Have mental slowness. ? Feel cold or shivery. ? Feel sleepy. ? Feel tired. ? Feel sore or achy, even in parts of your body where you did not have surgery. This information is not intended to replace advice given to you by your health care provider. Make sure you discuss any questions you have with your health care provider. Document Released: 10/05/2007 Document Revised: 12/09/2015 Document Reviewed: 06/12/2015 Elsevier Interactive Patient Education  2018 [employer]. General Anesthesia, Adult, Care After These instructions provide you with information about caring for yourself after your procedure. Your health care provider may also give you more specific instructions. Your treatment has been planned according to current medical practices, but problems sometimes occur. Call your health care provider if you have any problems or questions after your procedure. What can I expect after the procedure? After the procedure, it is common to have:  Vomiting.  A sore throat.  Mental slowness.  It is common to feel:  Nauseous.  Cold or shivery.  Sleepy.  Tired.  Sore or achy, even in parts of your body where you did not have surgery.  Follow these instructions at home: For at least 24 hours after the procedure:  Do not: ? Participate in activities where you could fall or become injured. ? Drive. ? Use heavy machinery. ? Drink alcohol. ? Take sleeping pills or medicines that cause drowsiness. ? Make important decisions or sign legal documents. ? Take care of children on your own.  Rest. Eating and drinking  If you vomit, drink water, juice, or soup when you can  drink without vomiting.  Drink enough fluid to keep your urine clear or pale yellow.  Make sure you have little or no nausea  before eating solid foods.  Follow the diet recommended by your health care provider. General instructions  Have a responsible adult stay with you until you are awake and alert.  Return to your normal activities as told by your health care provider. Ask your health care provider what activities are safe for you.  Take over-the-counter and prescription medicines only as told by your health care provider.  If you smoke, do not smoke without supervision.  Keep all follow-up visits as told by your health care provider. This is important. Contact a health care provider if:  You continue to have nausea or vomiting at home, and medicines are not helpful.  You cannot drink fluids or start eating again.  You cannot urinate after 8-12 hours.  You develop a skin rash.  You have fever.  You have increasing redness at the site of your procedure. Get help right away if:  You have difficulty breathing.  You have chest pain.  You have unexpected bleeding.  You feel that you are having a life-threatening or urgent problem. This information is not intended to replace advice given to you by your health care provider. Make sure you discuss any questions you have with your health care provider. Document Released: 10/04/2000 Document Revised: 12/01/2015 Document Reviewed: 06/12/2015 Elsevier Interactive Patient Education  [employer]2018 Elsevier Inc.

## 2017-05-20 ENCOUNTER — Encounter (HOSPITAL_COMMUNITY): Payer: Self-pay

## 2017-05-20 ENCOUNTER — Ambulatory Visit (HOSPITAL_COMMUNITY)
Admission: RE | Admit: 2017-05-20 | Discharge: 2017-05-20 | Disposition: A | Discharge status: Home / Self Care | Payer: Medicare HMO | Source: Ambulatory Visit | Attending: General Surgery | Admitting: General Surgery

## 2017-05-20 ENCOUNTER — Encounter (HOSPITAL_COMMUNITY)
Admission: RE | Disposition: A | Discharge status: Home / Self Care | Payer: Self-pay | Source: Ambulatory Visit | Attending: General Surgery

## 2017-05-20 ENCOUNTER — Ambulatory Visit (HOSPITAL_COMMUNITY): Payer: Medicare HMO | Admitting: Anesthesiology

## 2017-05-20 DIAGNOSIS — Z87891 Personal history of nicotine dependence: Secondary | ICD-10-CM | POA: Insufficient documentation

## 2017-05-20 DIAGNOSIS — Z96649 Presence of unspecified artificial hip joint: Secondary | ICD-10-CM | POA: Insufficient documentation

## 2017-05-20 DIAGNOSIS — E785 Hyperlipidemia, unspecified: Secondary | ICD-10-CM | POA: Diagnosis not present

## 2017-05-20 DIAGNOSIS — K8064 Calculus of gallbladder and bile duct with chronic cholecystitis without obstruction: Secondary | ICD-10-CM | POA: Diagnosis not present

## 2017-05-20 DIAGNOSIS — E079 Disorder of thyroid, unspecified: Secondary | ICD-10-CM | POA: Insufficient documentation

## 2017-05-20 DIAGNOSIS — K8020 Calculus of gallbladder without cholecystitis without obstruction: Secondary | ICD-10-CM | POA: Diagnosis not present

## 2017-05-20 DIAGNOSIS — K8010 Calculus of gallbladder with chronic cholecystitis without obstruction: Secondary | ICD-10-CM | POA: Diagnosis not present

## 2017-05-20 DIAGNOSIS — K8070 Calculus of gallbladder and bile duct without cholecystitis without obstruction: Secondary | ICD-10-CM | POA: Diagnosis not present

## 2017-05-20 HISTORY — PX: CHOLECYSTECTOMY: SHX55

## 2017-05-20 SURGERY — LAPAROSCOPIC CHOLECYSTECTOMY
Anesthesia: General | Site: Abdomen

## 2017-05-20 MED ORDER — MIDAZOLAM HCL 2 MG/2ML IJ SOLN
INTRAMUSCULAR | Status: AC
  Filled 2017-05-20: qty 2

## 2017-05-20 MED ORDER — ONDANSETRON HCL 4 MG/2ML IJ SOLN
4.0000 mg | Freq: Once | INTRAMUSCULAR | Status: AC
Start: 2017-05-20 — End: 2017-05-20
  Administered 2017-05-20: 4 mg via INTRAVENOUS

## 2017-05-20 MED ORDER — CIPROFLOXACIN IN D5W 400 MG/200ML IV SOLN
INTRAVENOUS | Status: AC
  Filled 2017-05-20: qty 200

## 2017-05-20 MED ORDER — BUPIVACAINE HCL (PF) 0.5 % IJ SOLN
INTRAMUSCULAR | Status: AC
  Filled 2017-05-20: qty 60

## 2017-05-20 MED ORDER — ROCURONIUM BROMIDE 50 MG/5ML IV SOLN
INTRAVENOUS | Status: AC
  Filled 2017-05-20: qty 1

## 2017-05-20 MED ORDER — FENTANYL CITRATE (PF) 100 MCG/2ML IJ SOLN
INTRAMUSCULAR | Status: AC
  Filled 2017-05-20: qty 2

## 2017-05-20 MED ORDER — NEOSTIGMINE METHYLSULFATE 10 MG/10ML IV SOLN
INTRAVENOUS | Status: AC
Start: 2017-05-20 — End: ?
  Filled 2017-05-20: qty 1

## 2017-05-20 MED ORDER — ROCURONIUM BROMIDE 100 MG/10ML IV SOLN
INTRAVENOUS | Status: DC | PRN
  Administered 2017-05-20: 30 mg via INTRAVENOUS

## 2017-05-20 MED ORDER — PROPOFOL 10 MG/ML IV BOLUS
INTRAVENOUS | Status: AC
  Filled 2017-05-20: qty 20

## 2017-05-20 MED ORDER — GLYCOPYRROLATE 0.2 MG/ML IJ SOLN
INTRAMUSCULAR | Status: DC | PRN
  Administered 2017-05-20: 0.2 mg via INTRAVENOUS
  Administered 2017-05-20: 0.4 mg via INTRAVENOUS

## 2017-05-20 MED ORDER — CIPROFLOXACIN IN D5W 400 MG/200ML IV SOLN
400.0000 mg | INTRAVENOUS | Status: AC
  Administered 2017-05-20: 400 mg via INTRAVENOUS

## 2017-05-20 MED ORDER — CHLORHEXIDINE GLUCONATE CLOTH 2 % EX PADS: 6.0000 | MEDICATED_PAD | Freq: Once | CUTANEOUS | Status: DC

## 2017-05-20 MED ORDER — PROMETHAZINE HCL 25 MG/ML IJ SOLN
INTRAMUSCULAR | Status: AC
  Filled 2017-05-20: qty 1

## 2017-05-20 MED ORDER — BUPIVACAINE HCL (PF) 0.5 % IJ SOLN
INTRAMUSCULAR | Status: DC | PRN
  Administered 2017-05-20: 10 mL

## 2017-05-20 MED ORDER — PROPOFOL 10 MG/ML IV BOLUS
INTRAVENOUS | Status: DC | PRN
  Administered 2017-05-20: 150 mg via INTRAVENOUS

## 2017-05-20 MED ORDER — ONDANSETRON HCL 4 MG/2ML IJ SOLN
4.0000 mg | Freq: Once | INTRAMUSCULAR | Status: AC
  Administered 2017-05-20: 4 mg via INTRAVENOUS

## 2017-05-20 MED ORDER — SODIUM CHLORIDE 0.9 % IR SOLN
Status: DC | PRN
  Administered 2017-05-20: 1000 mL

## 2017-05-20 MED ORDER — NEOSTIGMINE METHYLSULFATE 10 MG/10ML IV SOLN
INTRAVENOUS | Status: DC | PRN
  Administered 2017-05-20: 3 mg via INTRAVENOUS

## 2017-05-20 MED ORDER — ONDANSETRON HCL 4 MG/2ML IJ SOLN
INTRAMUSCULAR | Status: AC
  Filled 2017-05-20: qty 2

## 2017-05-20 MED ORDER — KETOROLAC TROMETHAMINE 30 MG/ML IJ SOLN
30.0000 mg | Freq: Once | INTRAMUSCULAR | Status: AC
  Administered 2017-05-20: 30 mg via INTRAVENOUS

## 2017-05-20 MED ORDER — DEXAMETHASONE SODIUM PHOSPHATE 4 MG/ML IJ SOLN
4.0000 mg | Freq: Once | INTRAMUSCULAR | Status: AC
  Administered 2017-05-20: 4 mg via INTRAVENOUS
  Filled 2017-05-20: qty 1

## 2017-05-20 MED ORDER — GLYCOPYRROLATE 0.2 MG/ML IJ SOLN
INTRAMUSCULAR | Status: AC
  Filled 2017-05-20: qty 3

## 2017-05-20 MED ORDER — LACTATED RINGERS IV SOLN
INTRAVENOUS | Status: DC
  Administered 2017-05-20: 09:00:00 via INTRAVENOUS

## 2017-05-20 MED ORDER — HYDROCODONE-ACETAMINOPHEN 5-325 MG PO TABS: 1.0000 | ORAL_TABLET | ORAL | 0 refills | Status: DC | PRN

## 2017-05-20 MED ORDER — POVIDONE-IODINE 10 % OINT PACKET
TOPICAL_OINTMENT | CUTANEOUS | Status: DC | PRN
  Administered 2017-05-20: 1 via TOPICAL

## 2017-05-20 MED ORDER — POVIDONE-IODINE 10 % EX OINT
TOPICAL_OINTMENT | CUTANEOUS | Status: AC
  Filled 2017-05-20: qty 2

## 2017-05-20 MED ORDER — HEMOSTATIC AGENTS (NO CHARGE) OPTIME
TOPICAL | Status: DC | PRN
  Administered 2017-05-20: 1 via TOPICAL

## 2017-05-20 MED ORDER — KETOROLAC TROMETHAMINE 30 MG/ML IJ SOLN
INTRAMUSCULAR | Status: AC
  Filled 2017-05-20: qty 1

## 2017-05-20 MED ORDER — FENTANYL CITRATE (PF) 100 MCG/2ML IJ SOLN
INTRAMUSCULAR | Status: DC | PRN
  Administered 2017-05-20: 50 ug via INTRAVENOUS
  Administered 2017-05-20: 100 ug via INTRAVENOUS
  Administered 2017-05-20: 50 ug via INTRAVENOUS

## 2017-05-20 MED ORDER — MIDAZOLAM HCL 2 MG/2ML IJ SOLN
1.0000 mg | INTRAMUSCULAR | Status: AC
  Administered 2017-05-20: 2 mg via INTRAVENOUS

## 2017-05-20 MED ORDER — PROMETHAZINE HCL 25 MG/ML IJ SOLN: 6.2500 mg | INTRAMUSCULAR | Status: DC | PRN

## 2017-05-20 MED ORDER — FENTANYL CITRATE (PF) 100 MCG/2ML IJ SOLN
25.0000 ug | INTRAMUSCULAR | Status: DC | PRN
  Filled 2017-05-20: qty 2

## 2017-05-20 SURGICAL SUPPLY — 49 items
APPLIER CLIP ROT 10 11.4 M/L (STAPLE) ×3
BAG HAMPER (MISCELLANEOUS) ×3 IMPLANT
BAG RETRIEVAL 10 (BASKET) ×1
BAG RETRIEVAL 10MM (BASKET) ×1
CHLORAPREP W/TINT 26ML (MISCELLANEOUS) ×3 IMPLANT
CLIP APPLIE ROT 10 11.4 M/L (STAPLE) ×1 IMPLANT
CLOTH BEACON ORANGE TIMEOUT ST (SAFETY) ×3 IMPLANT
COVER LIGHT HANDLE STERIS (MISCELLANEOUS) ×6 IMPLANT
DECANTER SPIKE VIAL GLASS SM (MISCELLANEOUS) ×3 IMPLANT
ELECT REM PT RETURN 9FT ADLT (ELECTROSURGICAL) ×3
ELECTRODE REM PT RTRN 9FT ADLT (ELECTROSURGICAL) ×1 IMPLANT
FILTER SMOKE EVAC LAPAROSHD (FILTER) ×3 IMPLANT
FORMALIN 10 PREFIL 120ML (MISCELLANEOUS) ×3 IMPLANT
GAUZE SPONGE 4X4 12PLY STRL (GAUZE/BANDAGES/DRESSINGS) ×3 IMPLANT
GLOVE BIO SURGEON STRL SZ7 (GLOVE) ×3 IMPLANT
GLOVE BIOGEL PI IND STRL 6.5 (GLOVE) ×2 IMPLANT
GLOVE BIOGEL PI IND STRL 7.0 (GLOVE) ×2 IMPLANT
GLOVE BIOGEL PI INDICATOR 6.5 (GLOVE) ×4
GLOVE BIOGEL PI INDICATOR 7.0 (GLOVE) ×4
GLOVE SURG SS PI 7.5 STRL IVOR (GLOVE) ×3 IMPLANT
GOWN STRL REUS W/ TWL XL LVL3 (GOWN DISPOSABLE) ×1 IMPLANT
GOWN STRL REUS W/TWL LRG LVL3 (GOWN DISPOSABLE) ×6 IMPLANT
GOWN STRL REUS W/TWL XL LVL3 (GOWN DISPOSABLE) ×2
HEMOSTAT SNOW SURGICEL 2X4 (HEMOSTASIS) ×3 IMPLANT
INST SET LAPROSCOPIC AP (KITS) ×3 IMPLANT
IV NS IRRIG 3000ML ARTHROMATIC (IV SOLUTION) IMPLANT
KIT ROOM TURNOVER APOR (KITS) ×3 IMPLANT
MANIFOLD NEPTUNE II (INSTRUMENTS) ×3 IMPLANT
NEEDLE INSUFFLATION 14GA 120MM (NEEDLE) ×3 IMPLANT
NS IRRIG 1000ML POUR BTL (IV SOLUTION) ×3 IMPLANT
PACK LAP CHOLE LZT030E (CUSTOM PROCEDURE TRAY) ×3 IMPLANT
PAD ARMBOARD 7.5X6 YLW CONV (MISCELLANEOUS) ×3 IMPLANT
SET BASIN LINEN APH (SET/KITS/TRAYS/PACK) ×3 IMPLANT
SET TUBE IRRIG SUCTION NO TIP (IRRIGATION / IRRIGATOR) IMPLANT
SLEEVE ENDOPATH XCEL 5M (ENDOMECHANICALS) ×3 IMPLANT
SPONGE GAUZE 2X2 8PLY STER LF (GAUZE/BANDAGES/DRESSINGS) ×1
SPONGE GAUZE 2X2 8PLY STRL LF (GAUZE/BANDAGES/DRESSINGS) ×2 IMPLANT
STAPLER VISISTAT (STAPLE) ×3 IMPLANT
SUT VICRYL 0 UR6 27IN ABS (SUTURE) ×3 IMPLANT
SYS BAG RETRIEVAL 10MM (BASKET) ×1
SYSTEM BAG RETRIEVAL 10MM (BASKET) ×1 IMPLANT
TAPE PAPER 2X10 WHT MICROPORE (GAUZE/BANDAGES/DRESSINGS) ×3 IMPLANT
TROCAR ENDO BLADELESS 11MM (ENDOMECHANICALS) ×3 IMPLANT
TROCAR XCEL NON-BLD 5MMX100MML (ENDOMECHANICALS) ×3 IMPLANT
TROCAR XCEL UNIV SLVE 11M 100M (ENDOMECHANICALS) ×3 IMPLANT
TUBE CONNECTING 12'X1/4 (SUCTIONS) ×1
TUBE CONNECTING 12X1/4 (SUCTIONS) ×2 IMPLANT
TUBING INSUFFLATION (TUBING) ×3 IMPLANT
WARMER LAPAROSCOPE (MISCELLANEOUS) ×3 IMPLANT

## 2017-05-20 NOTE — Transfer of Care (Signed)
Immediate Anesthesia Transfer of Care Note  Patient: Misty DodgeMonicia C Tosh  Procedure(s) Performed: LAPAROSCOPIC CHOLECYSTECTOMY (N/A Abdomen)  Patient Location: PACU  Anesthesia Type:General  Level of Consciousness: awake, alert  and oriented  Airway & Oxygen Therapy: Patient Spontanous Breathing and Patient connected to nasal cannula oxygen  Post-op Assessment: Report given to RN and Post -op Vital signs reviewed and stable  Post vital signs: Reviewed and stable  Last Vitals:  Vitals:   05/20/17 0825 05/20/17 0830  BP: 133/74 (!) 83/58  Pulse:    Resp: (!) 25 18  Temp:    SpO2: 91% 93%    Last Pain:  Vitals:   05/20/17 0728  TempSrc: Oral         Complications: No apparent anesthesia complications

## 2017-05-20 NOTE — Anesthesia Postprocedure Evaluation (Signed)
Anesthesia Post Note  Patient: Misty Parker  Procedure(s) Performed: LAPAROSCOPIC CHOLECYSTECTOMY (N/A Abdomen)  Patient location during evaluation: PACU Anesthesia Type: General Level of consciousness: awake and oriented Pain management: pain level controlled Vital Signs Assessment: post-procedure vital signs reviewed and stable Respiratory status: spontaneous breathing, nonlabored ventilation and respiratory function stable Cardiovascular status: blood pressure returned to baseline Postop Assessment: no apparent nausea or vomiting Anesthetic complications: no     Last Vitals:  Vitals:   05/20/17 1030 05/20/17 1047  BP: 93/78 (!) 127/52  Pulse: (!) 59 (!) 53  Resp: 12 18  Temp:    SpO2: 96% 94%    Last Pain:  Vitals:   05/20/17 1047  TempSrc:   PainSc: 0-No pain                 Vandy Tsuchiya J

## 2017-05-20 NOTE — Op Note (Signed)
Patient:  Misty DodgeMonicia C Crandell  DOB:  May 18, 1961  MRN:  [id number]   Preop Diagnosis: Biliary colic, cholelithiasis  Postop Diagnosis: Same  Procedure: Laparoscopic cholecystectomy  Surgeon: Franky MachoMark Marque Rademaker, MD  Assistant: Larae GroomsLindsey Bridges, MD  Anes: General endotracheal  Indications: Patient is a 56 year old white female who presents with biliary colic secondary to cholelithiasis.  The risks and benefits of the procedure including bleeding, infection, hepatobiliary injury, and the possibility of an open procedure were fully explained to the patient, who gave informed consent.  Procedure note: The patient was placed in supine position.  After induction of general endotracheal anesthesia, the abdomen was prepped and draped using usual sterile technique with DuraPrep.  Surgical site confirmation was performed.  A supraumbilical incision was made down to the fascia.  A Veress needle was introduced into the abdominal cavity and confirmation of placement was done using the saline drop test.  The abdomen was then insufflated to 16 mmHg pressure.  An 11 mm trocar was introduced into the abdominal cavity under direct visualization without difficulty.  The patient was placed in reverse Trendelenburg position and an additional 11 mm trocar was placed in the epigastric region and 5 mm trochars were placed in the right quadrant and right flank regions.  Liver was inspected and noted to be within normal limits.  The gallbladder was retracted in a dynamic fashion in order to provide a critical view of the triangle of Calot.  The cystic duct was first identified.  Its juncture to the infundibulum was fully identified.  Endoclips were placed proximally and distally on the cystic duct, and the cystic duct was divided.  This was likewise done to the cystic artery.  Gallbladder was freed away from the gallbladder fossa using Bovie electrocautery.  The gallbladder was delivered through the epic trocar site using an Endo  Catch bag.  The gallbladder fossa was inspected no abnormal bleeding or bile leakage was noted.  Surgicel was placed in the gallbladder fossa.  All fluid and air were then evacuated from the abdominal cavity prior to removal of the trochars.  All wounds were irrigated with normal saline.  All wounds were injected with 0.5% Sensorcaine.  The epigastric fascia was reapproximated using 0 Vicryl interrupted suture.  All skin incisions were closed using staples.  Betadine ointment and dry sterile dressings were applied.  All tape needle counts were correct at the end of the procedure.  Patient was extubated in the operating room and transferred to PACU in stable condition.  Complications: None  EBL: Minimal  Specimen: Gallbladder

## 2017-05-20 NOTE — Discharge Instructions (Signed)
Laparoscopic Cholecystectomy, Care After °This sheet gives you information about how to care for yourself after your procedure. Your health care provider may also give you more specific instructions. If you have problems or questions, contact your health care provider. °What can I expect after the procedure? °After the procedure, it is common to have: °· Pain at your incision sites. You will be given medicines to control this pain. °· Mild nausea or vomiting. °· Bloating and possible shoulder pain from the air-like gas that was used during the procedure. °Follow these instructions at home: °Incision care  ° °· Follow instructions from your health care provider about how to take care of your incisions. Make sure you: °¨ Wash your hands with soap and water before you change your bandage (dressing). If soap and water are not available, use hand sanitizer. °¨ Change your dressing as told by your health care provider. °¨ Leave stitches (sutures), skin glue, or adhesive strips in place. These skin closures may need to be in place for 2 weeks or longer. If adhesive strip edges start to loosen and curl up, you may trim the loose edges. Do not remove adhesive strips completely unless your health care provider tells you to do that. °· Do not take baths, swim, or use a hot tub until your health care provider approves. Ask your health care provider if you can take showers. You may only be allowed to take sponge baths for bathing. °· Check your incision area every day for signs of infection. Check for: °¨ More redness, swelling, or pain. °¨ More fluid or blood. °¨ Warmth. °¨ Pus or a bad smell. °Activity  °· Do not drive or use heavy machinery while taking prescription pain medicine. °· Do not lift anything that is heavier than 10 lb (4.5 kg) until your health care provider approves. °· Do not play contact sports until your health care provider approves. °· Do not drive for 24 hours if you were given a medicine to help you relax  (sedative). °· Rest as needed. Do not return to work or school until your health care provider approves. °General instructions  °· Take over-the-counter and prescription medicines only as told by your health care provider. °· To prevent or treat constipation while you are taking prescription pain medicine, your health care provider may recommend that you: °¨ Drink enough fluid to keep your urine clear or pale yellow. °¨ Take over-the-counter or prescription medicines. °¨ Eat foods that are high in fiber, such as fresh fruits and vegetables, whole grains, and beans. °¨ Limit foods that are high in fat and processed sugars, such as fried and sweet foods. °Contact a health care provider if: °· You develop a rash. °· You have more redness, swelling, or pain around your incisions. °· You have more fluid or blood coming from your incisions. °· Your incisions feel warm to the touch. °· You have pus or a bad smell coming from your incisions. °· You have a fever. °· One or more of your incisions breaks open. °Get help right away if: °· You have trouble breathing. °· You have chest pain. °· You have increasing pain in your shoulders. °· You faint or feel dizzy when you stand. °· You have severe pain in your abdomen. °· You have nausea or vomiting that lasts for more than one day. °· You have leg pain. °This information is not intended to replace advice given to you by your health care provider. Make sure you discuss any   questions you have with your health care provider. °Document Released: 06/28/2005 Document Revised: 01/17/2016 Document Reviewed: 12/15/2015 °Elsevier Interactive Patient Education © 2017 [employer] ° °

## 2017-05-20 NOTE — Interval H&P Note (Signed)
History and Physical Interval Note:  05/20/2017 7:16 AM  Misty Parker  has presented today for surgery, with the diagnosis of cholelithiasis  The various methods of treatment have been discussed with the patient and family. After consideration of risks, benefits and other options for treatment, the patient has consented to  Procedure(s): LAPAROSCOPIC CHOLECYSTECTOMY (N/A) as a surgical intervention .  The patient's history has been reviewed, patient examined, no change in status, stable for surgery.  I have reviewed the patient's chart and labs.  Questions were answered to the patient's satisfaction.     Franky MachoMark Dulse Rutan

## 2017-05-20 NOTE — Anesthesia Preprocedure Evaluation (Signed)
Anesthesia Evaluation  Patient identified by MRN, date of birth, ID band Patient awake    Reviewed: Allergy & Precautions, NPO status , Patient's Chart, lab work & pertinent test results  Airway Mallampati: I  TM Distance: >3 FB Neck ROM: Full    Dental  (+) Edentulous Upper, Edentulous Lower   Pulmonary former smoker,    breath sounds clear to auscultation       Cardiovascular negative cardio ROS   Rhythm:Regular Rate:Normal     Neuro/Psych negative neurological ROS  negative psych ROS   GI/Hepatic negative GI ROS, Neg liver ROS,   Endo/Other  Hypothyroidism   Renal/GU      Musculoskeletal  (+) Arthritis ,   Abdominal   Peds  Hematology   Anesthesia Other Findings   Reproductive/Obstetrics                             Anesthesia Physical Anesthesia Plan  ASA: II  Anesthesia Plan: General   Post-op Pain Management:    Induction: Intravenous  PONV Risk Score and Plan:   Airway Management Planned: Oral ETT  Additional Equipment:   Intra-op Plan:   Post-operative Plan: Extubation in OR  Informed Consent: I have reviewed the patients History and Physical, chart, labs and discussed the procedure including the risks, benefits and alternatives for the proposed anesthesia with the patient or authorized representative who has indicated his/her understanding and acceptance.     Plan Discussed with:   Anesthesia Plan Comments:         Anesthesia Quick Evaluation

## 2017-05-20 NOTE — Anesthesia Procedure Notes (Signed)
Procedure Name: Intubation Date/Time: 05/20/2017 8:52 AM Performed by: Jonna Munro, CRNA Pre-anesthesia Checklist: Patient identified, Emergency Drugs available, Suction available, Patient being monitored and Timeout performed Patient Re-evaluated:Patient Re-evaluated prior to induction Oxygen Delivery Method: Circle system utilized Preoxygenation: Pre-oxygenation with 100% oxygen Induction Type: IV induction Ventilation: Mask ventilation without difficulty Laryngoscope Size: Mac and 3 Grade View: Grade I Tube type: Oral Tube size: 7.0 mm Number of attempts: 1 Airway Equipment and Method: Stylet Placement Confirmation: ETT inserted through vocal cords under direct vision,  positive ETCO2 and breath sounds checked- equal and bilateral Secured at: 22 cm Tube secured with: Tape Dental Injury: Teeth and Oropharynx as per pre-operative assessment

## 2017-05-24 ENCOUNTER — Encounter (HOSPITAL_COMMUNITY): Payer: Self-pay | Admitting: General Surgery

## 2017-05-26 ENCOUNTER — Encounter: Payer: Self-pay | Admitting: General Surgery

## 2017-05-26 ENCOUNTER — Ambulatory Visit (INDEPENDENT_AMBULATORY_CARE_PROVIDER_SITE_OTHER): Payer: Medicare HMO | Admitting: General Surgery

## 2017-05-26 VITALS — BP 151/76 | HR 84 | Temp 98.2°F | Resp 18 | Ht 65.0 in | Wt 187.0 lb

## 2017-05-26 DIAGNOSIS — Z09 Encounter for follow-up examination after completed treatment for conditions other than malignant neoplasm: Secondary | ICD-10-CM

## 2017-05-26 NOTE — Progress Notes (Signed)
Subjective:     Misty Parker  Status post laparoscopic cholecystectomy.  Doing well.  Has no complaints. Objective:    BP (!) 151/76   Pulse 84   Temp 98.2 F (36.8 C)   Resp 18   Ht 5\' 5"  (1.651 m)   Wt 187 lb (84.8 kg)   BMI 31.12 kg/m   General:  alert, cooperative and no distress  Abdomen soft, incisions healing well.  Staples removed, Steri-Strips applied. Final pathology consistent with diagnosis.     Assessment:    Doing well postoperatively.    Plan:   Increase activity as able.  Follow-up as needed.

## 2017-11-01 DIAGNOSIS — E785 Hyperlipidemia, unspecified: Secondary | ICD-10-CM | POA: Diagnosis not present

## 2017-11-01 DIAGNOSIS — T50905A Adverse effect of unspecified drugs, medicaments and biological substances, initial encounter: Secondary | ICD-10-CM | POA: Diagnosis not present

## 2017-11-01 DIAGNOSIS — L659 Nonscarring hair loss, unspecified: Secondary | ICD-10-CM | POA: Diagnosis not present

## 2017-11-01 DIAGNOSIS — E039 Hypothyroidism, unspecified: Secondary | ICD-10-CM | POA: Diagnosis not present

## 2017-11-06 IMAGING — US US ABDOMEN COMPLETE
2 series · 13 of 25 positions shown · non-contrast
Comparison: None.

CLINICAL DATA: Abdominal pain with vomiting for 2 weeks, worse
postprandially.

EXAM:
ABDOMEN ULTRASOUND COMPLETE

[Series 1: us abdomen complete · 0.13mm/px · 12 of 118 slices shown (1 of 2)]
[im 1/118]
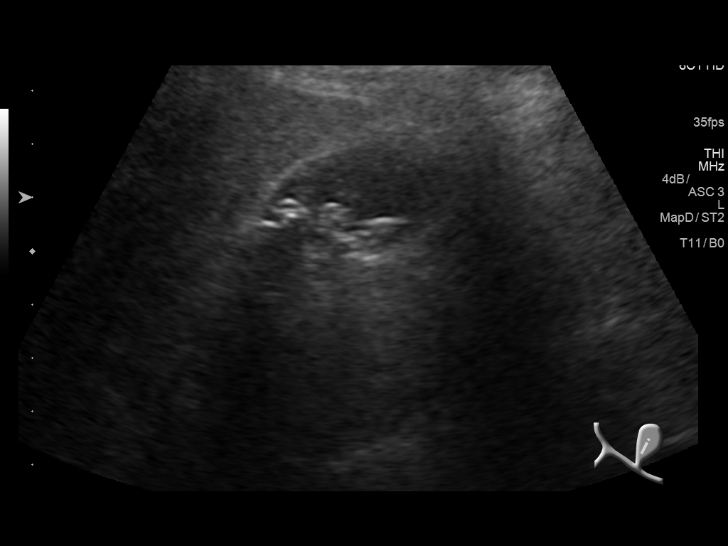
[im 11/118]
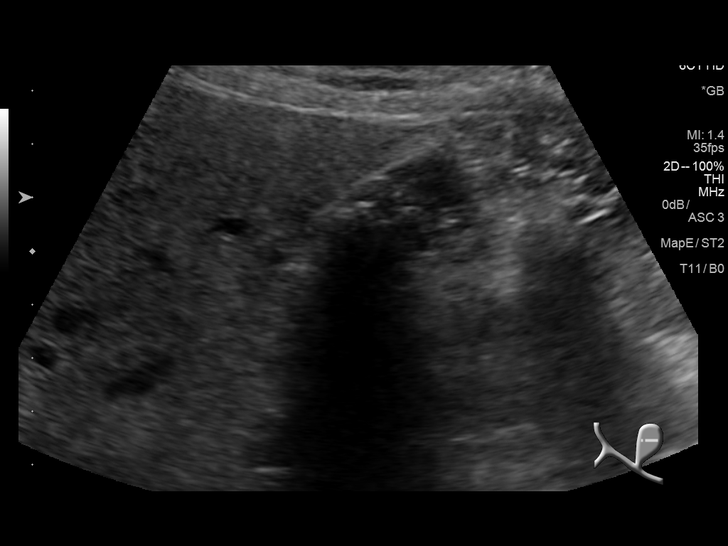
[im 21/118]
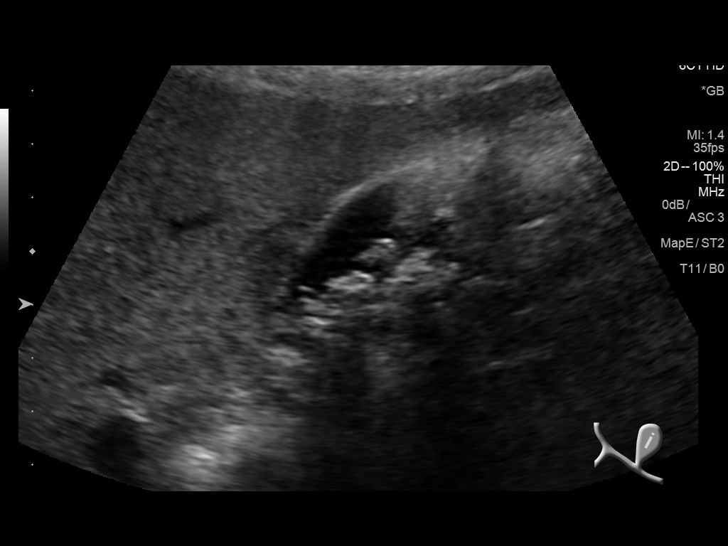
[im 31/118]
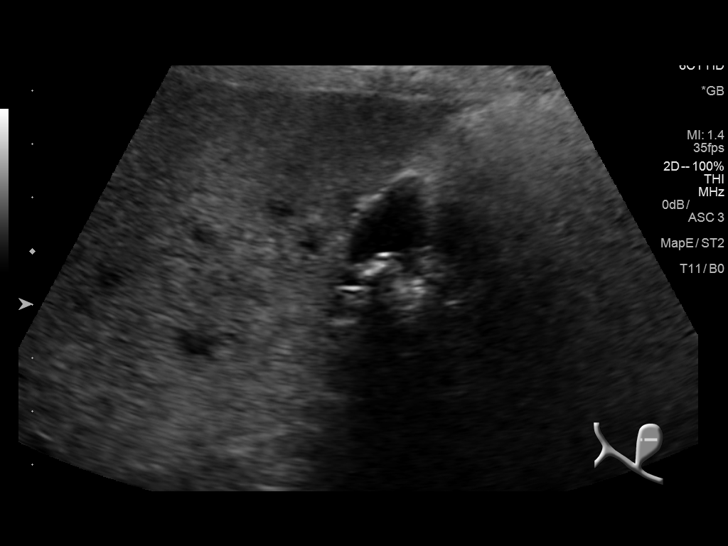
[im 41/118]
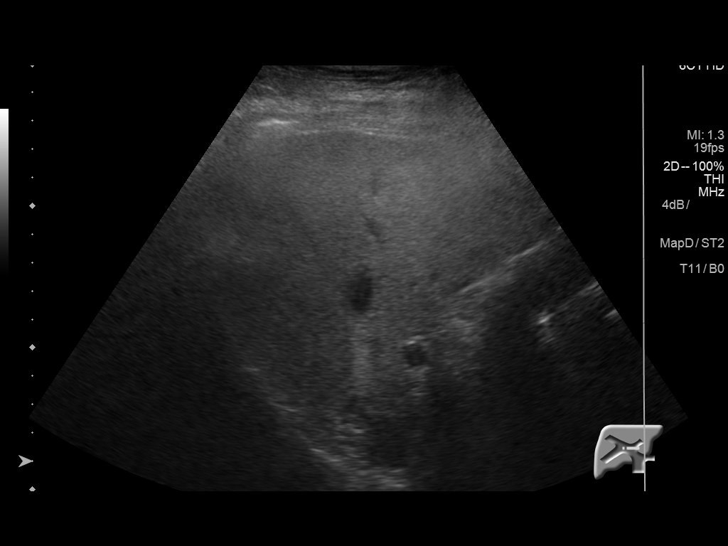
[im 51/118]
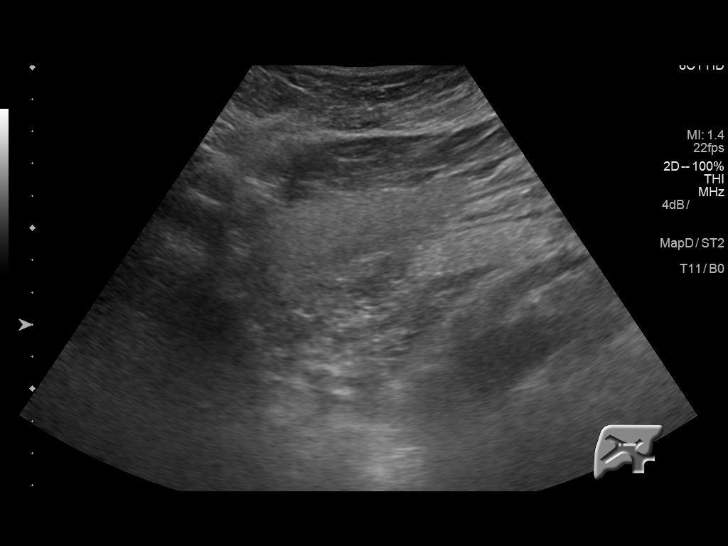
[im 62/118]
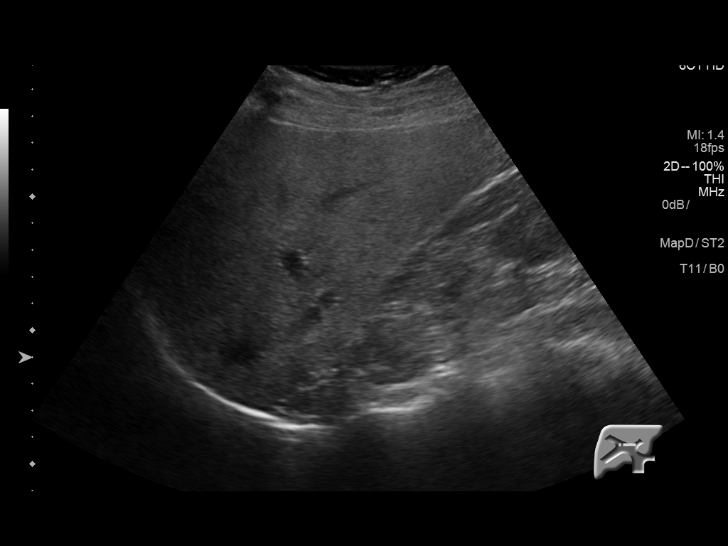
[im 72/118]
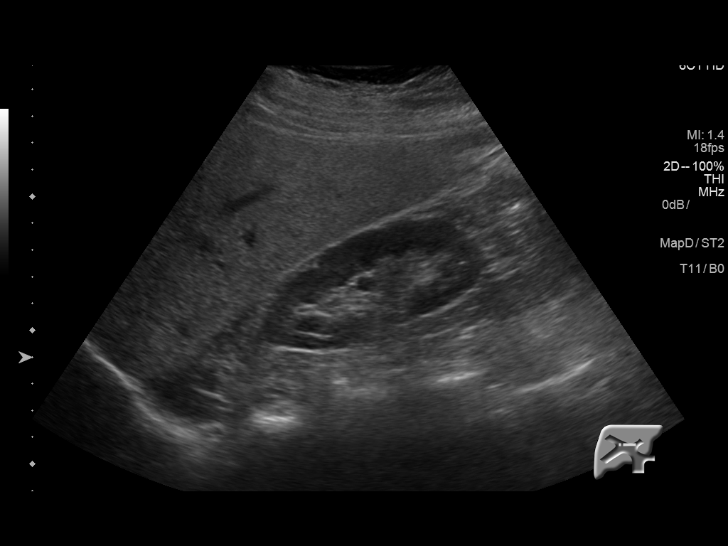
[im 82/118]
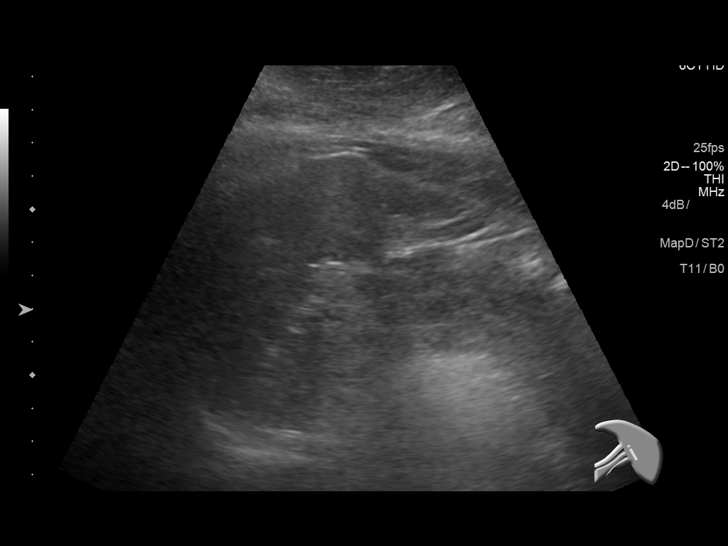
[im 92/118]
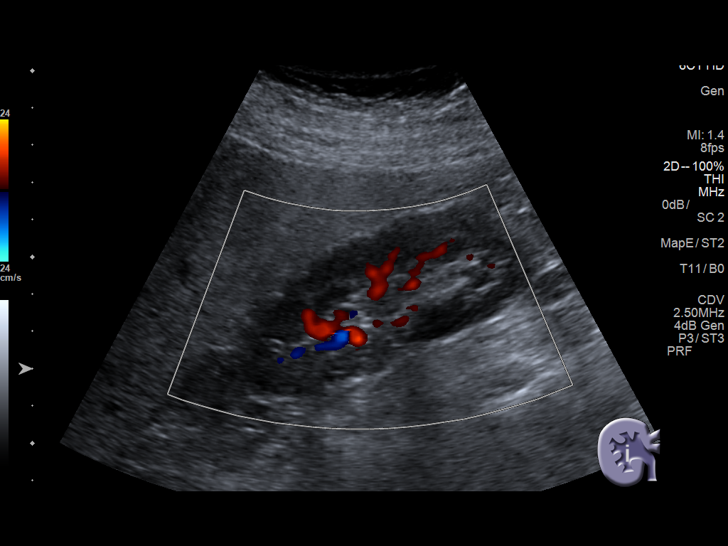
[im 102/118]
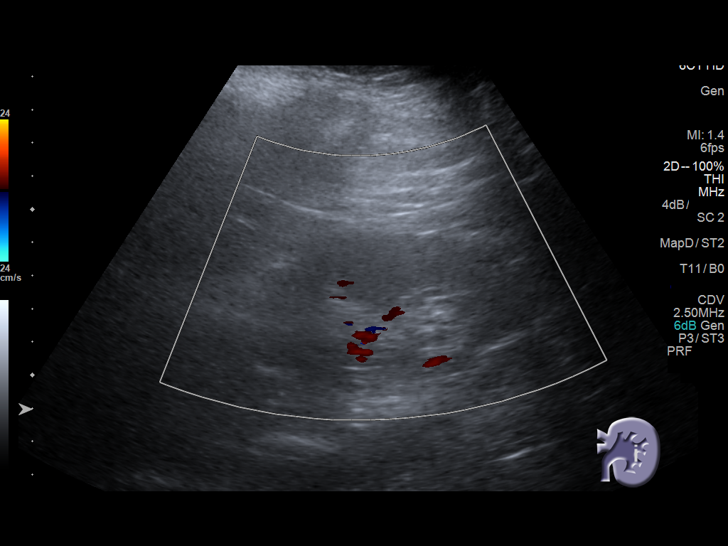
[im 112/118]
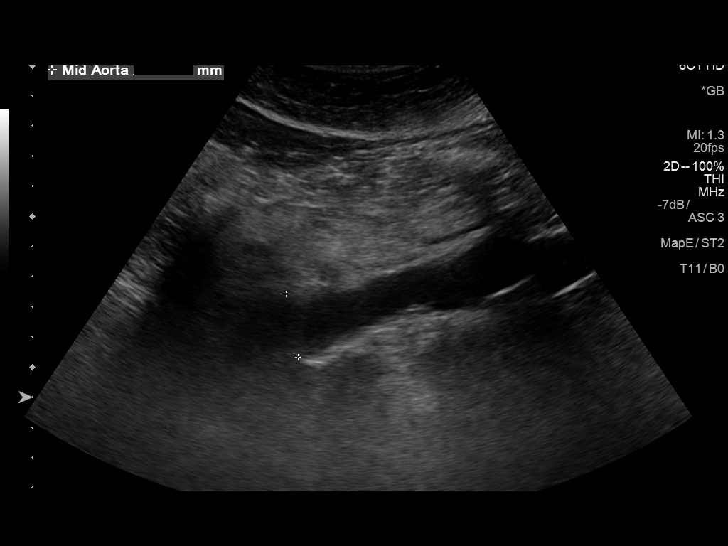

[Series 2001: us abdomen complete · 0.31mm/px · 1 of 1 slices shown (2 of 2)]
[im 1/1]
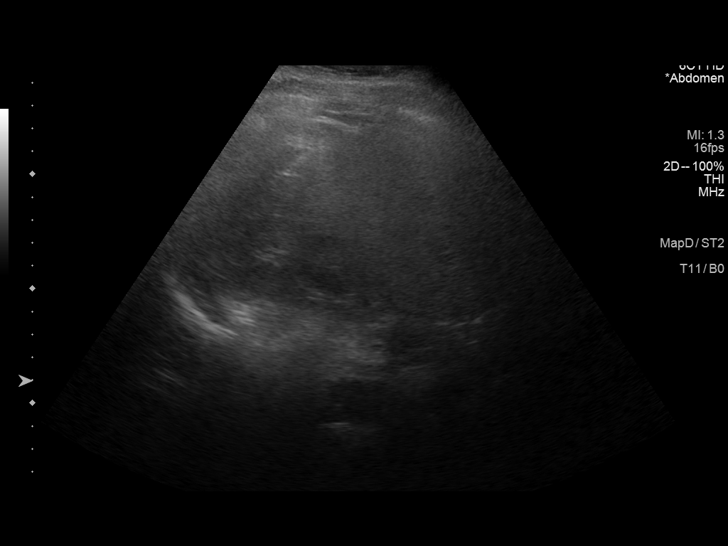

[13 of 25 positions shown; findings below may reference images not displayed]

FINDINGS: Gallbladder: Gallbladder is nearly filled with shadowing calcified
gallstones measuring up to the 1.2 cm. No definite gallbladder wall
thickening. No significant gallbladder distention. No
pericholecystic fluid. No sonographic Murphy's sign.

Common bile duct: Diameter: 4 mm

Liver: Liver parenchyma is diffusely moderately echogenic with
posterior acoustic attenuation, compatible with diffuse hepatic
steatosis. No definite liver surface irregularity. No liver mass,
noting decreased sensitivity in the setting of an echogenic liver.
Portal vein is patent on color Doppler imaging with normal direction
of blood flow towards the liver.

IVC: No abnormality visualized.

Pancreas: Visualized portion unremarkable.

Spleen: Size and appearance within normal limits.

Right Kidney: Length: 9.6 cm. Echogenicity within normal limits. No
mass or hydronephrosis visualized.

Left Kidney: Length: 9.7 cm. Echogenicity within normal limits. No
mass or hydronephrosis visualized.

Abdominal aorta: Atherosclerotic nonaneurysmal abdominal aorta.

Other findings: None.
IMPRESSION: 1. Cholelithiasis.  No evidence of acute cholecystitis .
2. No biliary ductal dilatation.
3. Moderate diffuse hepatic steatosis.
4. Atherosclerotic nonaneurysmal abdominal aorta.
5. Otherwise normal abdominal sonogram.

## 2018-01-31 ENCOUNTER — Encounter: Payer: Self-pay | Admitting: Pulmonary Disease

## 2018-01-31 DIAGNOSIS — E876 Hypokalemia: Secondary | ICD-10-CM | POA: Diagnosis not present

## 2018-01-31 DIAGNOSIS — E669 Obesity, unspecified: Secondary | ICD-10-CM | POA: Diagnosis not present

## 2018-01-31 DIAGNOSIS — E785 Hyperlipidemia, unspecified: Secondary | ICD-10-CM | POA: Diagnosis not present

## 2018-01-31 DIAGNOSIS — E039 Hypothyroidism, unspecified: Secondary | ICD-10-CM | POA: Diagnosis not present

## 2018-02-14 ENCOUNTER — Other Ambulatory Visit (HOSPITAL_COMMUNITY): Payer: Self-pay | Admitting: Pulmonary Disease

## 2018-02-14 DIAGNOSIS — R945 Abnormal results of liver function studies: Secondary | ICD-10-CM

## 2018-02-14 DIAGNOSIS — R7989 Other specified abnormal findings of blood chemistry: Secondary | ICD-10-CM

## 2018-02-17 ENCOUNTER — Ambulatory Visit (HOSPITAL_COMMUNITY)
Admission: RE | Admit: 2018-02-17 | Discharge: 2018-02-17 | Disposition: A | Discharge status: Home / Self Care | Payer: Medicare HMO | Source: Ambulatory Visit | Attending: Pulmonary Disease | Admitting: Pulmonary Disease

## 2018-02-17 DIAGNOSIS — Z9049 Acquired absence of other specified parts of digestive tract: Secondary | ICD-10-CM | POA: Insufficient documentation

## 2018-02-17 DIAGNOSIS — R7989 Other specified abnormal findings of blood chemistry: Secondary | ICD-10-CM

## 2018-02-17 DIAGNOSIS — R945 Abnormal results of liver function studies: Secondary | ICD-10-CM

## 2018-02-17 DIAGNOSIS — K760 Fatty (change of) liver, not elsewhere classified: Secondary | ICD-10-CM | POA: Diagnosis not present

## 2018-06-22 DIAGNOSIS — E039 Hypothyroidism, unspecified: Secondary | ICD-10-CM | POA: Diagnosis not present

## 2018-06-22 DIAGNOSIS — G4700 Insomnia, unspecified: Secondary | ICD-10-CM | POA: Diagnosis not present

## 2018-06-22 DIAGNOSIS — E785 Hyperlipidemia, unspecified: Secondary | ICD-10-CM | POA: Diagnosis not present

## 2018-06-22 DIAGNOSIS — E669 Obesity, unspecified: Secondary | ICD-10-CM | POA: Diagnosis not present

## 2018-06-28 DIAGNOSIS — E785 Hyperlipidemia, unspecified: Secondary | ICD-10-CM | POA: Diagnosis not present

## 2018-06-28 DIAGNOSIS — G4700 Insomnia, unspecified: Secondary | ICD-10-CM | POA: Diagnosis not present

## 2018-06-28 DIAGNOSIS — E039 Hypothyroidism, unspecified: Secondary | ICD-10-CM | POA: Diagnosis not present

## 2018-06-28 DIAGNOSIS — E669 Obesity, unspecified: Secondary | ICD-10-CM | POA: Diagnosis not present

## 2018-06-28 LAB — BASIC METABOLIC PANEL
BUN: 9 (ref 4–21)
CO2: 22 (ref 13–22)
Chloride: 105 (ref 99–108)
Creatinine: 1 (ref <=1.1)
Glucose: 89
Potassium: 4 (ref 3.4–5.3)
Sodium: 140 (ref 137–147)

## 2018-06-28 LAB — COMPREHENSIVE METABOLIC PANEL
Albumin: 4 (ref 3.5–5.0)
Calcium: 9 (ref 8.7–10.7)
GFR calc Af Amer: 72
GFR calc non Af Amer: 62
Globulin: 2.3

## 2018-06-28 LAB — HEPATIC FUNCTION PANEL
ALT: 59 — AB (ref 7–35)
AST: 70 — AB (ref 13–35)
Alkaline Phosphatase: 52 (ref 25–125)
Bilirubin, Total: 1.2

## 2018-08-22 ENCOUNTER — Other Ambulatory Visit (HOSPITAL_COMMUNITY): Payer: Self-pay | Admitting: Pulmonary Disease

## 2018-08-22 DIAGNOSIS — E669 Obesity, unspecified: Secondary | ICD-10-CM | POA: Diagnosis not present

## 2018-08-22 DIAGNOSIS — E069 Thyroiditis, unspecified: Secondary | ICD-10-CM | POA: Diagnosis not present

## 2018-08-22 DIAGNOSIS — E039 Hypothyroidism, unspecified: Secondary | ICD-10-CM | POA: Diagnosis not present

## 2018-08-25 ENCOUNTER — Ambulatory Visit (HOSPITAL_COMMUNITY)
Admission: RE | Admit: 2018-08-25 | Discharge: 2018-08-25 | Disposition: A | Discharge status: Home / Self Care | Payer: Medicare HMO | Source: Ambulatory Visit | Attending: Pulmonary Disease | Admitting: Pulmonary Disease

## 2018-08-25 DIAGNOSIS — E069 Thyroiditis, unspecified: Secondary | ICD-10-CM | POA: Diagnosis not present

## 2018-11-24 IMAGING — US US ABDOMEN COMPLETE
1 series · 14 of 25 positions shown · non-contrast
Comparison: Prior abdominal ultrasound 04/27/2017

CLINICAL DATA: 56-year-old female with abnormal LFTs. Past history
of cholecystectomy.

EXAM:
ABDOMEN ULTRASOUND COMPLETE

[Series 1: us abdomen complete · 0.21mm/px · 14 of 79 slices shown]
[im 1/79]
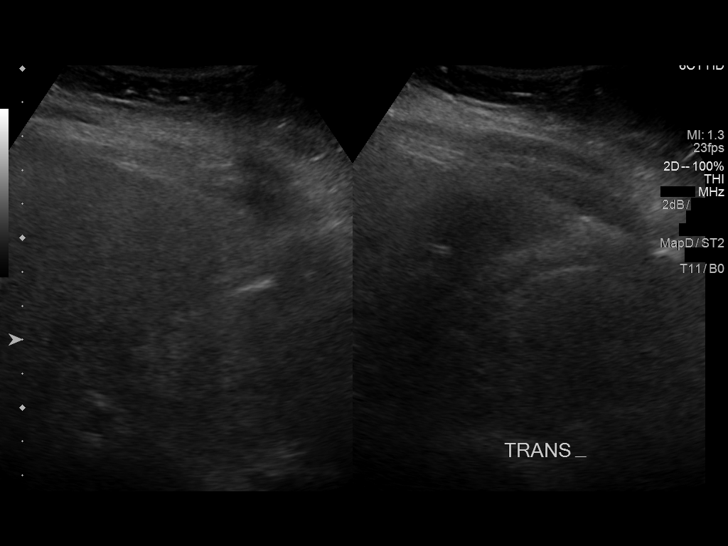
[im 7/79]
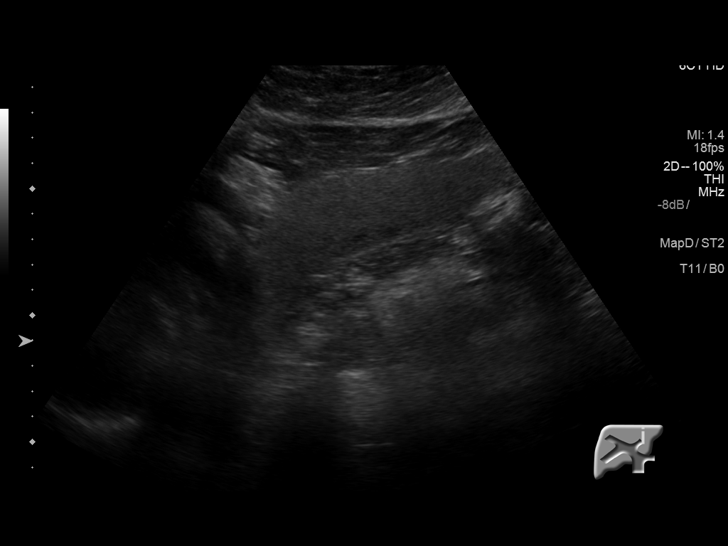
[im 14/79]
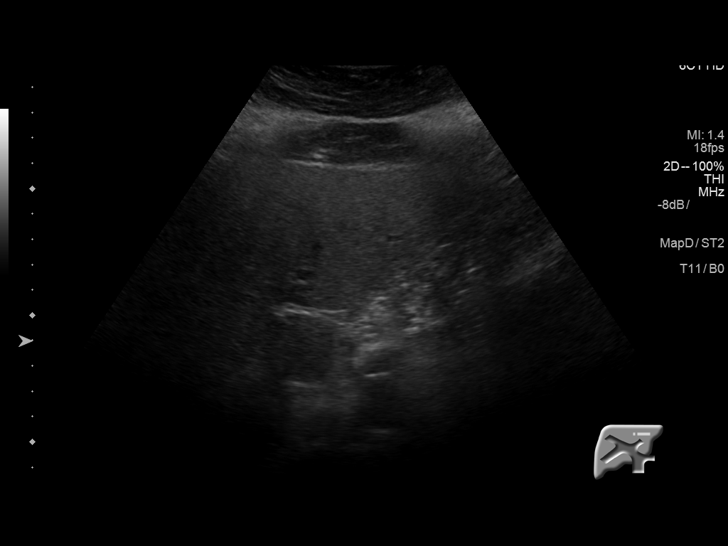
[im 20/79]
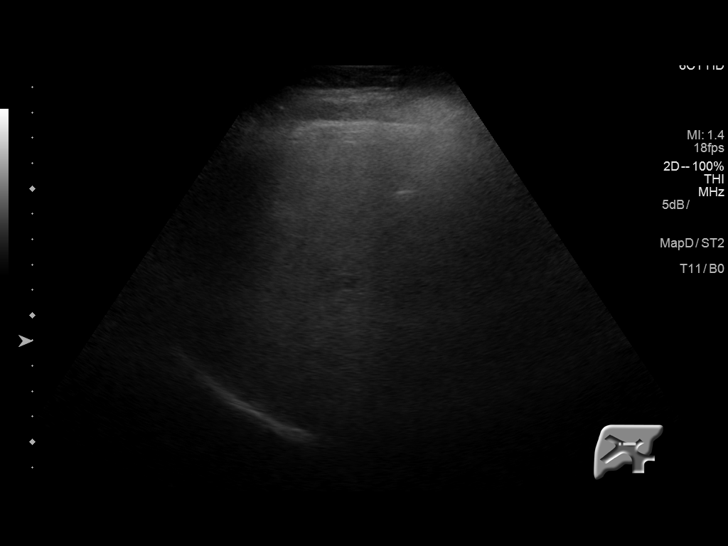
[im 27/79]
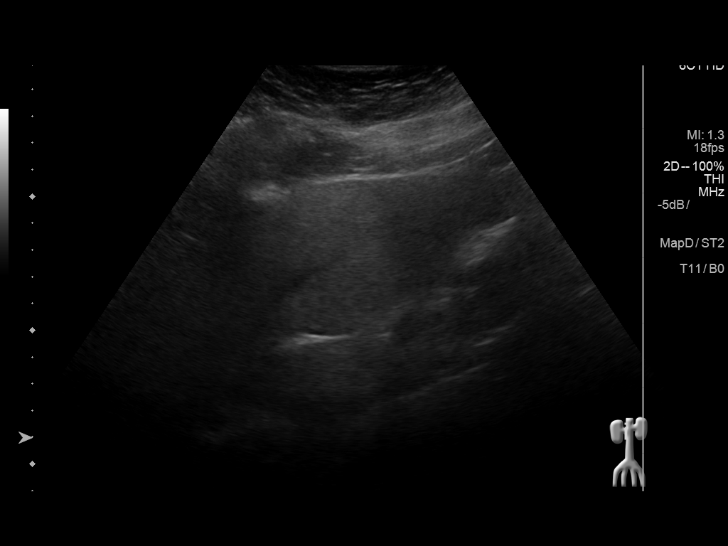
[im 30/79]
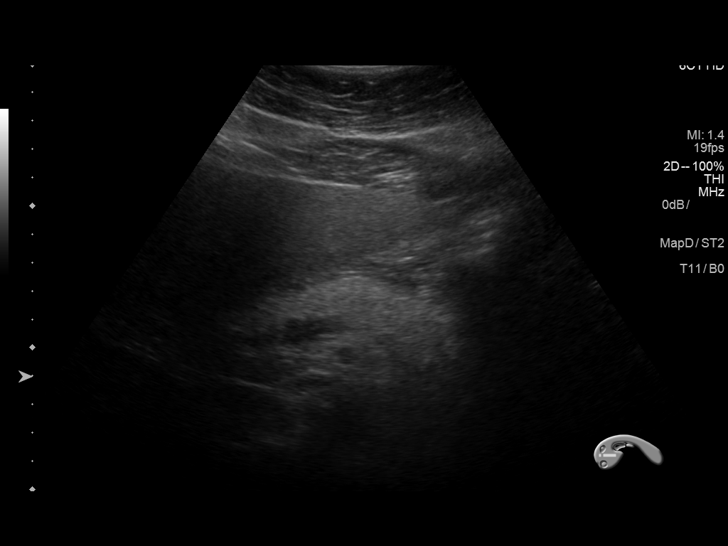
[im 36/79]
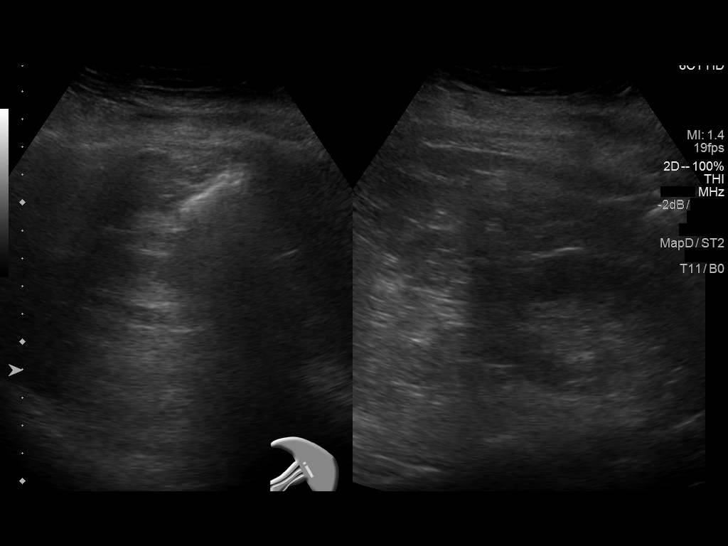
[im 43/79]
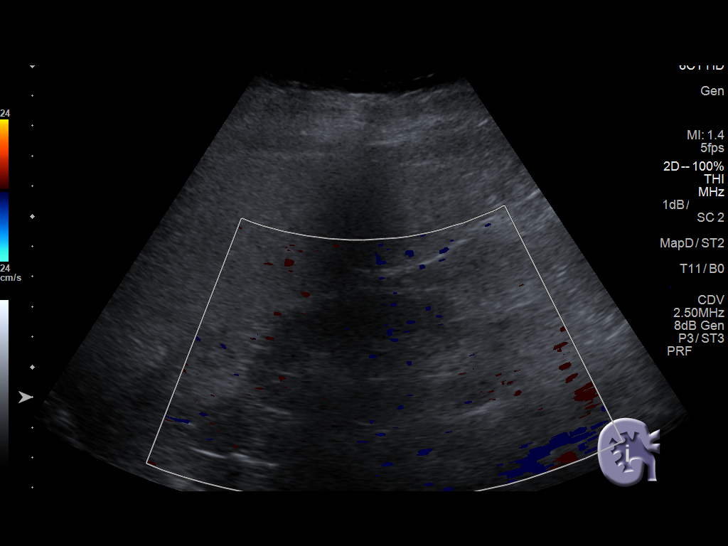
[im 49/79]
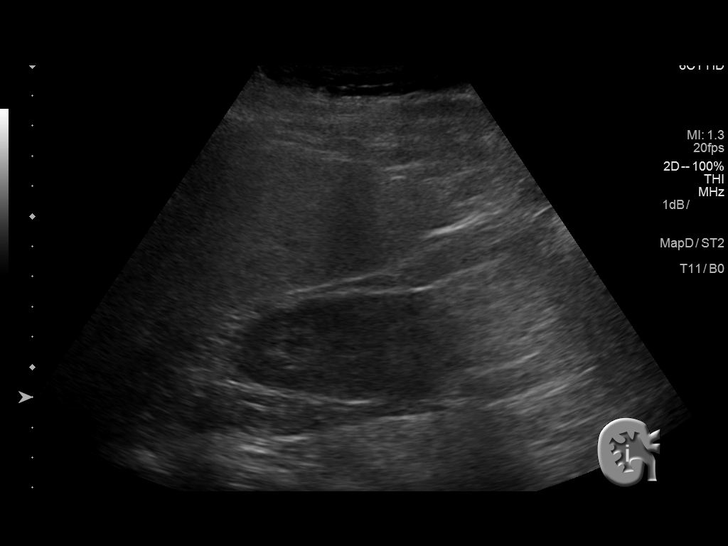
[im 53/79]
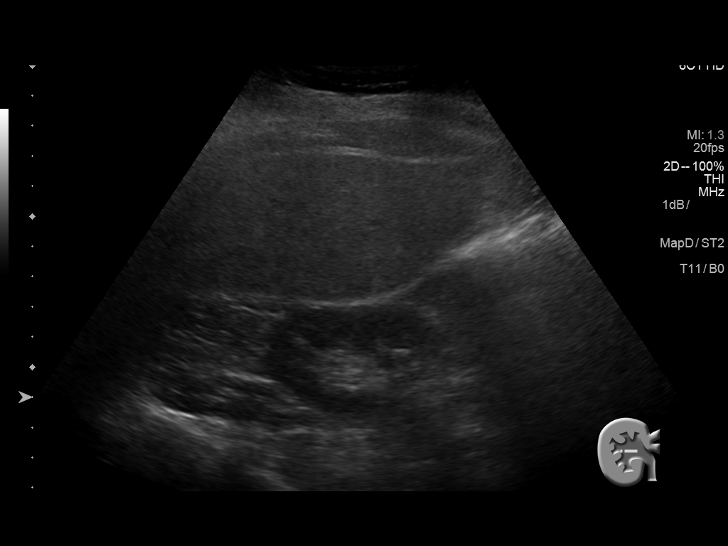
[im 59/79]
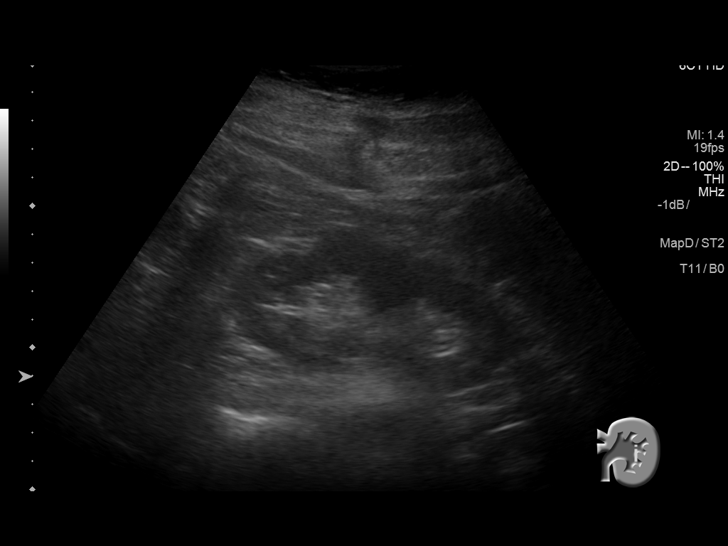
[im 66/79]
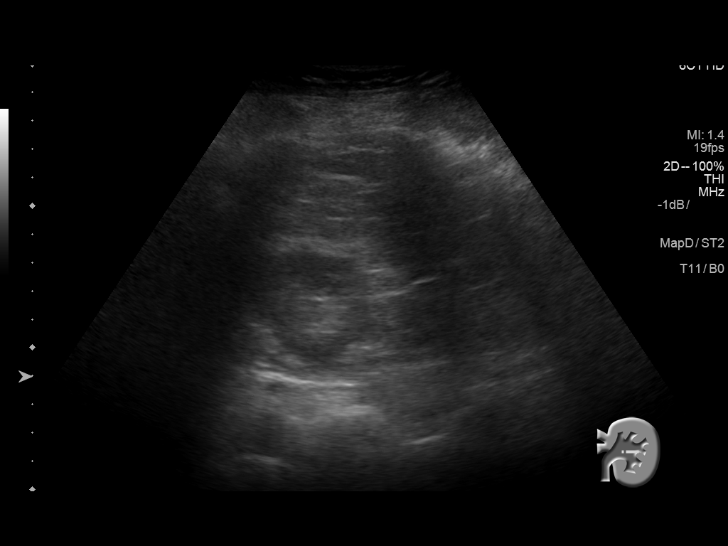
[im 72/79]
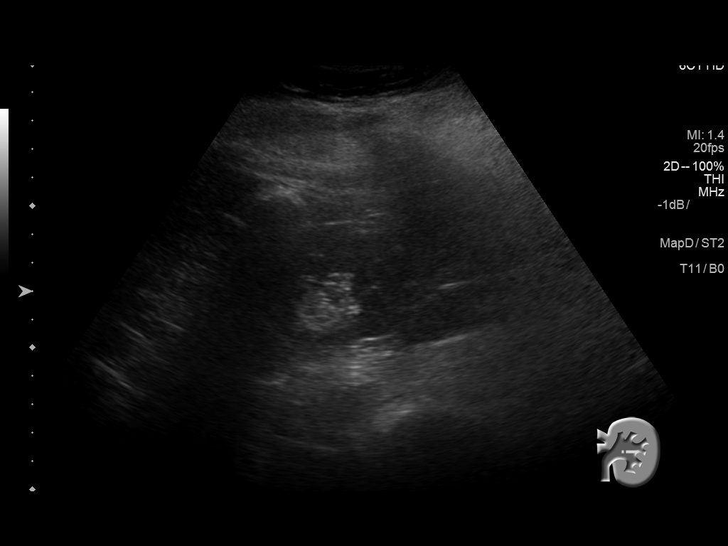
[im 79/79]
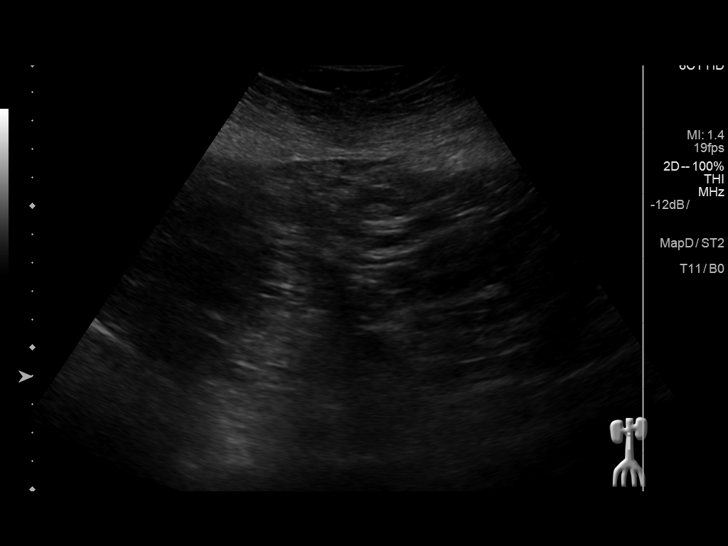

[14 of 25 positions shown; findings below may reference images not displayed]

FINDINGS: Gallbladder: Surgically absent

Common bile duct: Diameter: Within normal limits at 6 mm

Liver: The hepatic parenchyma is diffusely echogenic and there is
coarsening of the hepatic echotexture. The adjacent renal parenchyma
appears hypoechoic in comparison. portal vein is patent on color
Doppler imaging with normal direction of blood flow towards the
liver.

IVC: No abnormality visualized.

Pancreas: Visualized portion unremarkable.

Spleen: Size and appearance within normal limits.

Right Kidney: Length: 9.9 cm. Echogenicity within normal limits. No
mass or hydronephrosis visualized.

Left Kidney: Length: 10.5 cm. Echogenicity within normal limits. No
mass or hydronephrosis visualized.

Abdominal aorta: No aneurysm visualized.

Other findings: None.
IMPRESSION: 1. No acute abnormality.
2. Surgical changes of prior cholecystectomy.
3. At least moderate hepatic steatosis.
4. No evidence of biliary ductal dilatation.

## 2018-12-20 DIAGNOSIS — E039 Hypothyroidism, unspecified: Secondary | ICD-10-CM | POA: Diagnosis not present

## 2018-12-20 DIAGNOSIS — E669 Obesity, unspecified: Secondary | ICD-10-CM | POA: Diagnosis not present

## 2018-12-20 DIAGNOSIS — J301 Allergic rhinitis due to pollen: Secondary | ICD-10-CM | POA: Diagnosis not present

## 2018-12-20 DIAGNOSIS — E785 Hyperlipidemia, unspecified: Secondary | ICD-10-CM | POA: Diagnosis not present

## 2018-12-20 LAB — LIPID PANEL
Cholesterol: 171 (ref 0–200)
HDL: 34 — AB (ref 35–70)
LDL Cholesterol: 115
Triglycerides: 116 (ref 40–160)

## 2018-12-20 LAB — TSH: TSH: 0.46 (ref <=5.90)

## 2019-03-06 IMAGING — US US THYROID
1 series · 13 of 25 positions shown · non-contrast
Comparison: None.

CLINICAL DATA: Thyroiditis.  Hypothyroidism.

EXAM:
THYROID ULTRASOUND
TECHNIQUE: Ultrasound examination of the thyroid gland and adjacent soft
tissues was performed.

[Series 1: us thyroid · 13 of 72 slices shown]
[im 1/72]
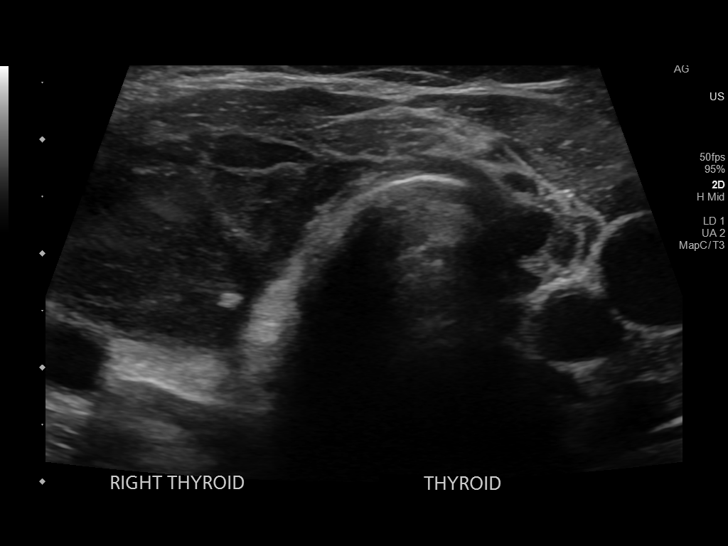
[im 6/72]
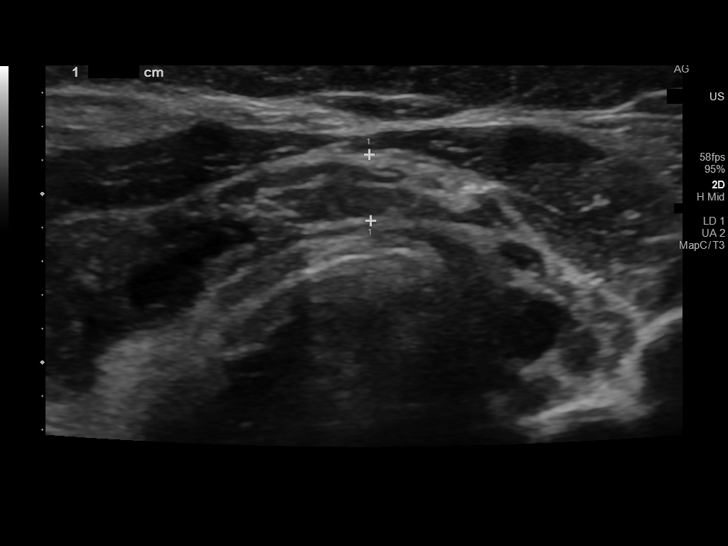
[im 12/72]
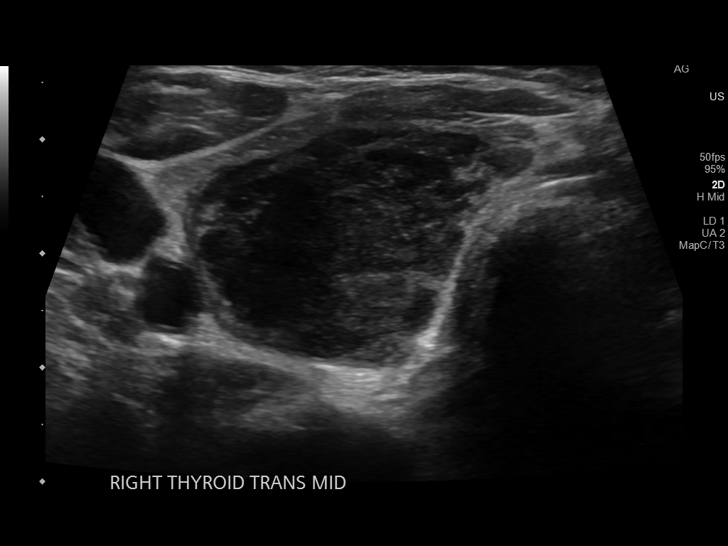
[im 18/72]
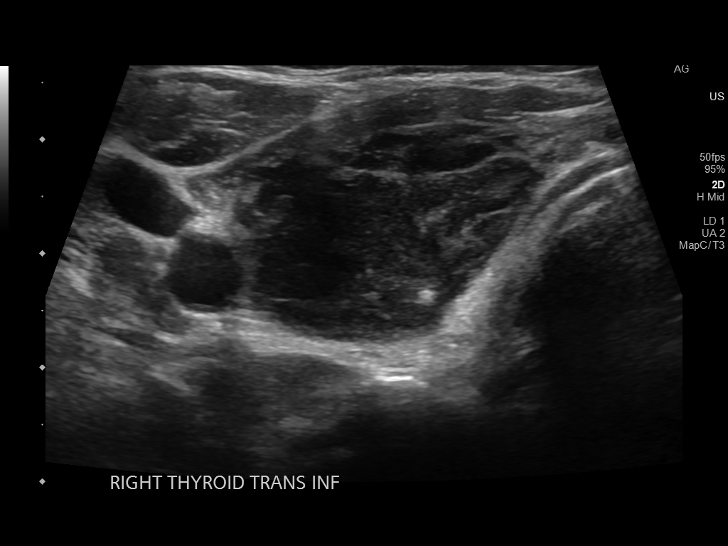
[im 24/72]
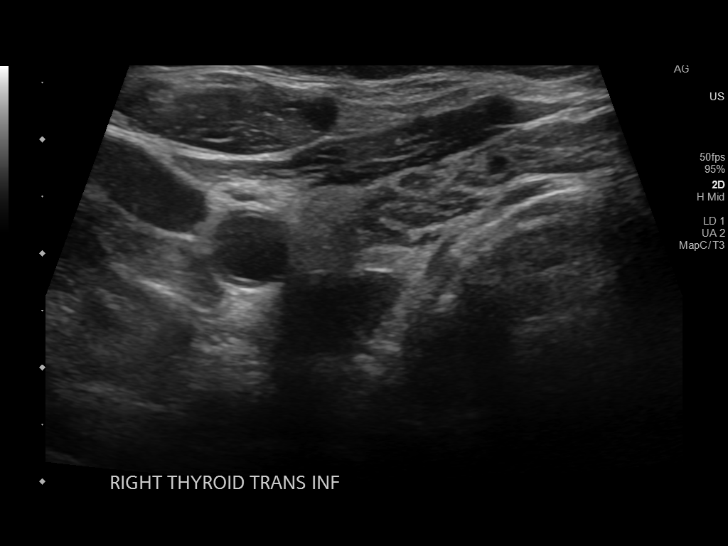
[im 30/72]
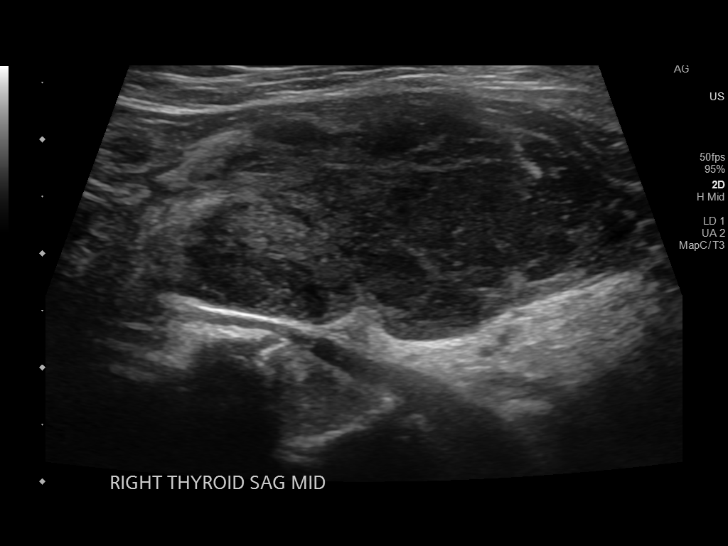
[im 36/72]
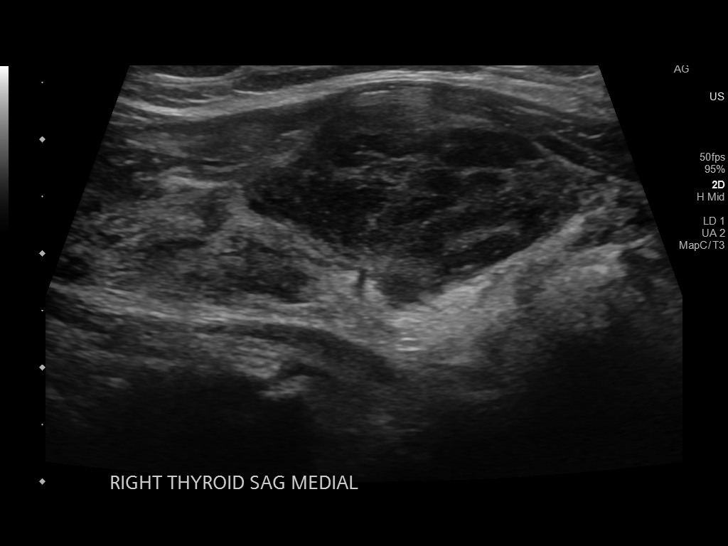
[im 42/72]
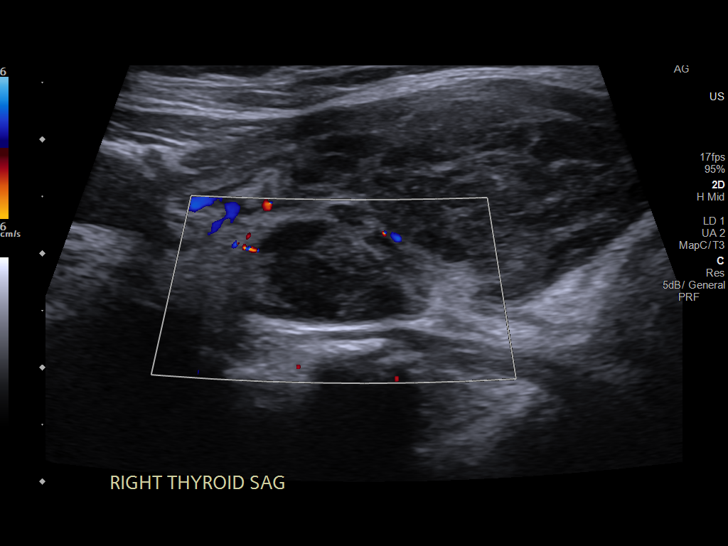
[im 48/72]
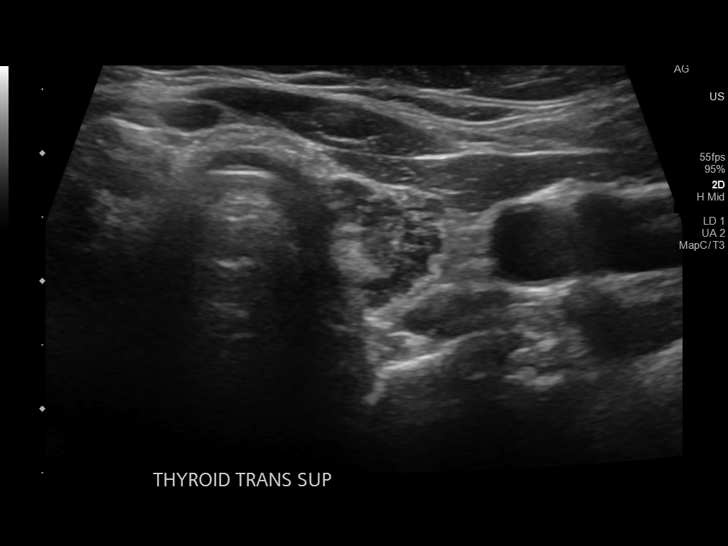
[im 54/72]
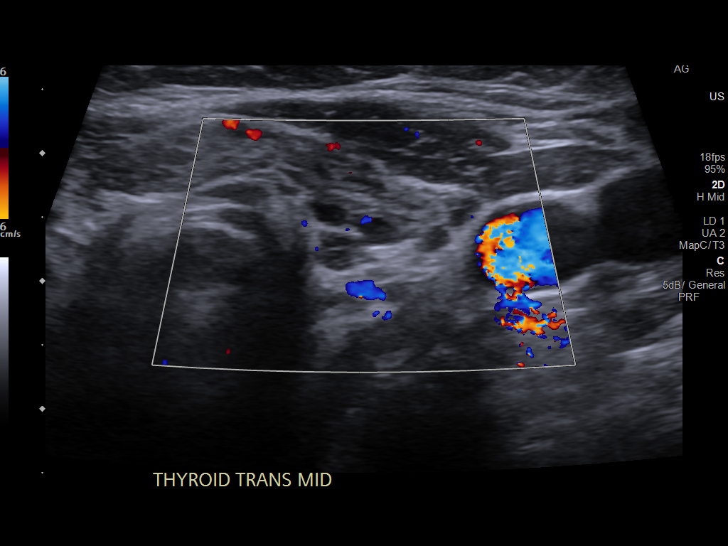
[im 60/72]
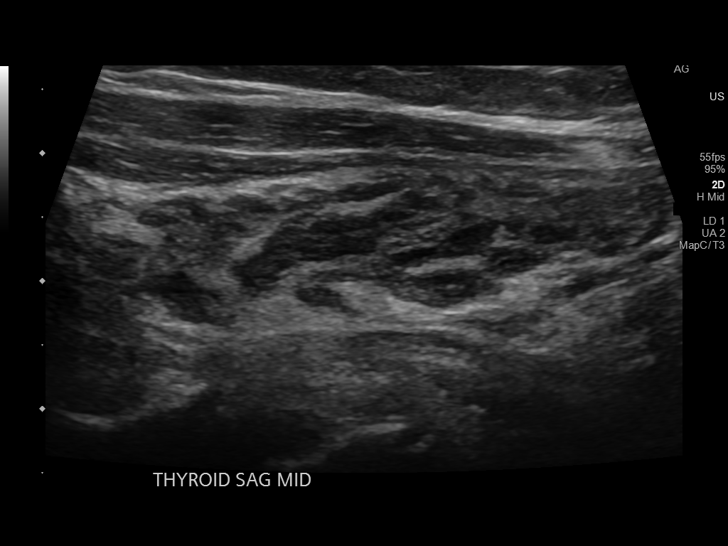
[im 66/72]
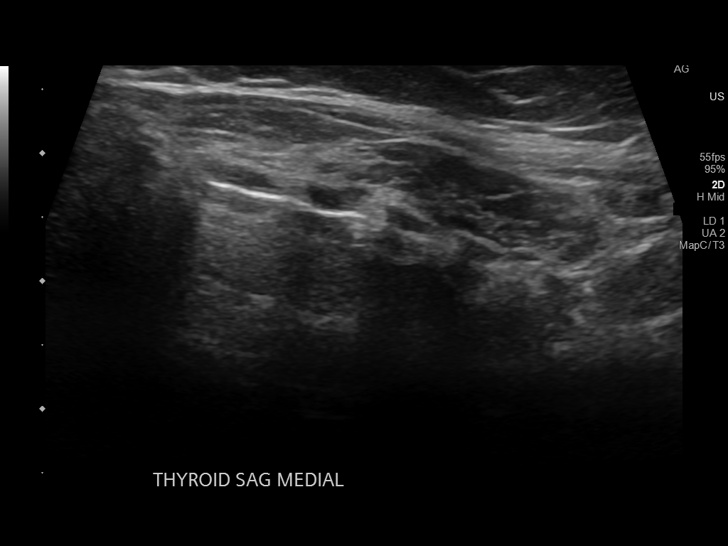
[im 72/72]
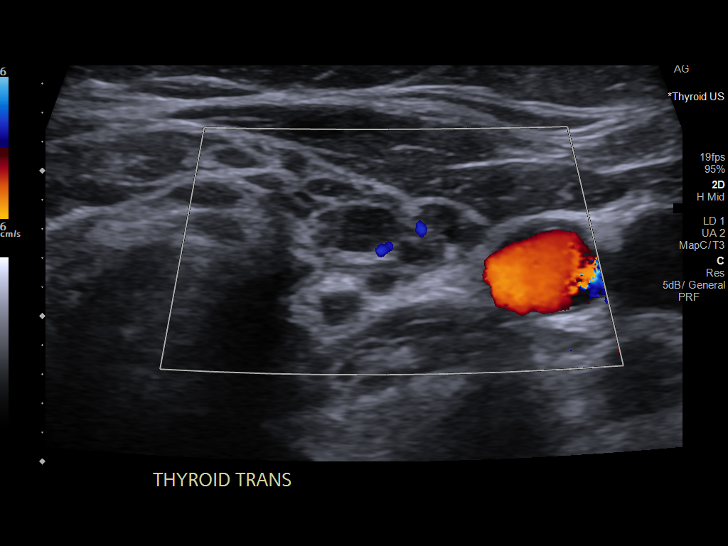

[13 of 25 positions shown; findings below may reference images not displayed]

FINDINGS: Parenchymal Echotexture: Markedly heterogenous

Isthmus: 0.4 cm

Right lobe: 4.6 x 2.3 x 2.4 cm

Left lobe: 4.3 x 1.2 x 1.6 cm

_________________________________________________________

Estimated total number of nodules >/= 1 cm: 1

Number of spongiform nodules >/=  2 cm not described below (TR1): 0

Number of mixed cystic and solid nodules >/= 1.5 cm not described
below (TR2): 0

_________________________________________________________

Probable pseudo nodule along the superior posterior aspect of the
right thyroid lobe. This nodular area is isoechoic to the remainder
of the right thyroid tissue. This possible pseudo nodule measures
1.7 x 0.9 x 1.2 cm.

Very hypoechoic nodular area in the superior left thyroid lobe is
most likely related to heterogeneity and a pseudo nodule. This area
measures up to 0.9 cm.
IMPRESSION: Thyroid tissue is markedly heterogeneous and likely secondary to
thyroiditis.

Probable bilateral pseudo nodules as described, largest on the right
side. The right nodular area measures up to 1.7 cm. If this did
represent a true nodule, it would be a TR 3 nodule and would meet
criteria for 1 year follow-up. Consider 1 year follow-up of this
nodular area although it likely represents a pseudo nodule.

The above is in keeping with the ACR TI-RADS recommendations - [HOSPITAL] 7331;[DATE].

## 2019-03-26 DIAGNOSIS — E876 Hypokalemia: Secondary | ICD-10-CM | POA: Diagnosis not present

## 2019-03-26 DIAGNOSIS — E785 Hyperlipidemia, unspecified: Secondary | ICD-10-CM | POA: Diagnosis not present

## 2019-03-26 DIAGNOSIS — J301 Allergic rhinitis due to pollen: Secondary | ICD-10-CM | POA: Diagnosis not present

## 2019-03-26 DIAGNOSIS — Z23 Encounter for immunization: Secondary | ICD-10-CM | POA: Diagnosis not present

## 2019-03-26 DIAGNOSIS — E039 Hypothyroidism, unspecified: Secondary | ICD-10-CM | POA: Diagnosis not present

## 2019-05-14 ENCOUNTER — Ambulatory Visit: Payer: Medicare HMO | Admitting: Orthopedic Surgery

## 2019-05-14 ENCOUNTER — Other Ambulatory Visit: Payer: Self-pay

## 2019-05-14 ENCOUNTER — Encounter: Payer: Self-pay | Admitting: Orthopedic Surgery

## 2019-07-06 DIAGNOSIS — E669 Obesity, unspecified: Secondary | ICD-10-CM | POA: Insufficient documentation

## 2019-07-06 DIAGNOSIS — E039 Hypothyroidism, unspecified: Secondary | ICD-10-CM | POA: Insufficient documentation

## 2019-07-06 DIAGNOSIS — F17200 Nicotine dependence, unspecified, uncomplicated: Secondary | ICD-10-CM | POA: Insufficient documentation

## 2019-07-06 DIAGNOSIS — T50905A Adverse effect of unspecified drugs, medicaments and biological substances, initial encounter: Secondary | ICD-10-CM | POA: Insufficient documentation

## 2019-07-06 DIAGNOSIS — R079 Chest pain, unspecified: Secondary | ICD-10-CM | POA: Insufficient documentation

## 2019-07-06 DIAGNOSIS — E876 Hypokalemia: Secondary | ICD-10-CM | POA: Insufficient documentation

## 2019-07-06 DIAGNOSIS — L659 Nonscarring hair loss, unspecified: Secondary | ICD-10-CM | POA: Insufficient documentation

## 2019-07-06 DIAGNOSIS — E069 Thyroiditis, unspecified: Secondary | ICD-10-CM | POA: Insufficient documentation

## 2019-07-06 DIAGNOSIS — J301 Allergic rhinitis due to pollen: Secondary | ICD-10-CM

## 2019-07-06 DIAGNOSIS — I739 Peripheral vascular disease, unspecified: Secondary | ICD-10-CM | POA: Insufficient documentation

## 2019-07-06 DIAGNOSIS — G4700 Insomnia, unspecified: Secondary | ICD-10-CM

## 2019-07-06 DIAGNOSIS — E785 Hyperlipidemia, unspecified: Secondary | ICD-10-CM | POA: Insufficient documentation

## 2019-09-26 ENCOUNTER — Other Ambulatory Visit: Payer: Self-pay

## 2019-09-26 ENCOUNTER — Encounter: Payer: Self-pay | Admitting: Family Medicine

## 2019-09-26 ENCOUNTER — Ambulatory Visit (INDEPENDENT_AMBULATORY_CARE_PROVIDER_SITE_OTHER): Payer: Medicare HMO | Admitting: Family Medicine

## 2019-09-26 VITALS — BP 142/88 | HR 72 | Temp 98.2°F | Ht 65.0 in | Wt 228.0 lb

## 2019-09-26 DIAGNOSIS — E039 Hypothyroidism, unspecified: Secondary | ICD-10-CM

## 2019-09-26 MED ORDER — LEVOTHYROXINE SODIUM 100 MCG PO TABS: 100.0000 ug | ORAL_TABLET | Freq: Every day | ORAL | 1 refills | Status: DC

## 2019-09-26 NOTE — Progress Notes (Signed)
New Patient Office Visit  Subjective:  Patient ID: Misty Parker, female    DOB: 1960-10-13  Age: 59 y.o. MRN: [id number]  CC:  Chief Complaint  Patient presents with  . Medication Refill    patient need a refill on levothyroxine 100 mcg    HPI Misty Parker presents for hypothyroid 134mcg daily-no recent change  Past Medical History:  Diagnosis Date  . Adverse effect of unspecified drugs, medicaments and biological substances, initial encounter   . Allergic rhinitis due to pollen   . Allergy   . Chest pain, unspecified   . Hyperlipidemia   . Hyperlipidemia, unspecified   . Hypokalemia   . Hypothyroidism   . Hypothyroidism, unspecified   . Insomnia, unspecified   . Lumbago   . Nicotine dependence, unspecified, uncomplicated   . Nonscarring hair loss, unspecified   . Obesity, unspecified   . Pain in joint involving pelvic region and thigh   . Peripheral vascular disease, unspecified ([place])   . Thyroid disease   . Thyroiditis, unspecified     Past Surgical History:  Procedure Laterality Date  . CHOLECYSTECTOMY N/A 05/20/2017   Procedure: LAPAROSCOPIC CHOLECYSTECTOMY;  Surgeon: Aviva Signs, MD;  Location: [facility];  Service: General;  Laterality: N/A;  . REVISION TOTAL HIP ARTHROPLASTY  2010 2011  . TUBAL LIGATION      Family History  Problem Relation Age of Onset  . Hypertension Mother   . Diabetes Mother   . Heart disease Father   . Hypertension Father     Social History   Socioeconomic History  . Marital status: Divorced    Spouse name: Not on file  . Number of children: Not on file  . Years of education: Not on file  . Highest education level: Not on file  Occupational History  . Not on file  Tobacco Use  . Smoking status: Former Smoker    Years: 1.00    Types: Cigarettes    Quit date: 03/17/2017    Years since quitting: 2.5  . Smokeless tobacco: Never Used  Substance and Sexual Activity  . Alcohol use: No  . Drug use: No  . Sexual  activity: Not on file  Other Topics Concern  . Not on file  Social History Narrative  . Not on file   Social Determinants of Health   Financial Resource Strain:   . Difficulty of Paying Living Expenses:   Food Insecurity:   . Worried About Charity fundraiser in the Last Year:   . Arboriculturist in the Last Year:   Transportation Needs:   . Film/video editor (Medical):   Marland Kitchen Lack of Transportation (Non-Medical):   Physical Activity:   . Days of Exercise per Week:   . Minutes of Exercise per Session:   Stress:   . Feeling of Stress :   Social Connections:   . Frequency of Communication with Friends and Family:   . Frequency of Social Gatherings with Friends and Family:   . Attends Religious Services:   . Active Member of Clubs or Organizations:   . Attends Archivist Meetings:   Marland Kitchen Marital Status:   Intimate Partner Violence:   . Fear of Current or Ex-Partner:   . Emotionally Abused:   Marland Kitchen Physically Abused:   . Sexually Abused:     ROS Review of Systems  Constitutional: Negative.   HENT: Negative.   Eyes:       Glasses  Respiratory: Negative.  Cardiovascular: Negative.   Gastrointestinal: Negative.   Endocrine: Positive for cold intolerance.  Genitourinary: Negative.   Musculoskeletal: Negative.   Allergic/Immunologic: Positive for environmental allergies.  Neurological: Negative.   Hematological: Negative.   Psychiatric/Behavioral: Negative.     Objective:   Today's Vitals: BP (!) 142/88 (BP Location: Left Arm, Patient Position: Sitting, Cuff Size: Large)   Pulse 72   Temp 98.2 F (36.8 C) (Temporal)   Ht 5\' 5"  (1.651 m)   Wt 228 lb (103.4 kg)   SpO2 98%   BMI 37.94 kg/m   Physical Exam Constitutional:      Appearance: Normal appearance.  HENT:     Head: Normocephalic and atraumatic.  Eyes:     Conjunctiva/sclera: Conjunctivae normal.  Cardiovascular:     Rate and Rhythm: Normal rate and regular rhythm.     Pulses: Normal pulses.      Heart sounds: Normal heart sounds.  Pulmonary:     Effort: Pulmonary effort is normal.     Breath sounds: Normal breath sounds.  Musculoskeletal:     Cervical back: Normal range of motion and neck supple.  Neurological:     Mental Status: She is alert and oriented to person, place, and time.  Psychiatric:        Mood and Affect: Mood normal.        Behavior: Behavior normal.     Assessment & Plan:  1. Hypothyroidism, unspecified type rx-TSH 2020 normal-repeat 12/2019  Outpatient Encounter Medications as of 09/26/2019  Medication Sig  . levothyroxine (SYNTHROID, LEVOTHROID) 100 MCG tablet Take 1 tablet daily by mouth.  . [DISCONTINUED] atorvastatin (LIPITOR) 20 MG tablet Take 1 tablet daily by mouth.  . [DISCONTINUED] HYDROcodone-acetaminophen (NORCO) 5-325 MG tablet Take 1 tablet every 4 (four) hours as needed by mouth for moderate pain.  . [DISCONTINUED] rosuvastatin (CRESTOR) 5 MG tablet Take 5 mg by mouth daily.   No facility-administered encounter medications on file as of 09/26/2019.    Follow-up: primary care doctor to recheck TSH Consider mammogram for screening  Misty Parker 09/28/2019, MD

## 2020-08-21 ENCOUNTER — Encounter: Payer: Self-pay | Admitting: Nurse Practitioner

## 2020-08-21 ENCOUNTER — Other Ambulatory Visit: Payer: Self-pay

## 2020-08-21 ENCOUNTER — Ambulatory Visit (INDEPENDENT_AMBULATORY_CARE_PROVIDER_SITE_OTHER): Payer: Medicare HMO | Admitting: Nurse Practitioner

## 2020-08-21 VITALS — BP 176/96 | HR 82 | Temp 98.6°F | Resp 20 | Ht 65.0 in | Wt 231.0 lb

## 2020-08-21 DIAGNOSIS — G4700 Insomnia, unspecified: Secondary | ICD-10-CM | POA: Diagnosis not present

## 2020-08-21 DIAGNOSIS — Z7689 Persons encountering health services in other specified circumstances: Secondary | ICD-10-CM | POA: Diagnosis not present

## 2020-08-21 DIAGNOSIS — E039 Hypothyroidism, unspecified: Secondary | ICD-10-CM | POA: Diagnosis not present

## 2020-08-21 DIAGNOSIS — Z139 Encounter for screening, unspecified: Secondary | ICD-10-CM

## 2020-08-21 DIAGNOSIS — I739 Peripheral vascular disease, unspecified: Secondary | ICD-10-CM | POA: Diagnosis not present

## 2020-08-21 DIAGNOSIS — E785 Hyperlipidemia, unspecified: Secondary | ICD-10-CM | POA: Diagnosis not present

## 2020-08-21 DIAGNOSIS — E1159 Type 2 diabetes mellitus with other circulatory complications: Secondary | ICD-10-CM

## 2020-08-21 DIAGNOSIS — I152 Hypertension secondary to endocrine disorders: Secondary | ICD-10-CM | POA: Diagnosis not present

## 2020-08-21 DIAGNOSIS — I10 Essential (primary) hypertension: Secondary | ICD-10-CM | POA: Insufficient documentation

## 2020-08-21 MED ORDER — OLMESARTAN MEDOXOMIL 20 MG PO TABS: 20.0000 mg | ORAL_TABLET | Freq: Every day | ORAL | 1 refills | Status: DC

## 2020-08-21 NOTE — Assessment & Plan Note (Signed)
-  may consider adding medication on at her physical -she does not want anything too sedating

## 2020-08-21 NOTE — Assessment & Plan Note (Signed)
-  she has had blurred vision, pressure in her head, and facial flushing for about a week. -BP 176/96 -Rx. olmesartan

## 2020-08-21 NOTE — Assessment & Plan Note (Signed)
-  she has been taking levothyroxine 100 mcg daily -she has 3-4 days of medication left; will need refill base don results of labs

## 2020-08-21 NOTE — Progress Notes (Signed)
New Patient Office Visit  Subjective:  Patient ID: Misty Parker, female    DOB: 11/21/1960  Age: 60 y.o. MRN: [id number]  CC:  Chief Complaint  Patient presents with  . New Patient (Initial Visit)    HPI Misty Parker presents for new patient visit. No PCP since Dr. Holly Bodily left. [place] previously. No recent physical or labs. She has hx of hypothyroidism. She states she has 3-4 days left on her medication.  She states that she has had blurred vision, pressure in her head, and facial flushing for about a week. Her BP is elevated at 176/96.  Past Medical History:  Diagnosis Date  . Adverse effect of unspecified drugs, medicaments and biological substances, initial encounter   . Allergic rhinitis due to pollen   . Allergy    seasonal/pollen  . Chest pain, unspecified   . Hyperlipidemia   . Hyperlipidemia, unspecified   . Hypokalemia   . Hypothyroidism   . Hypothyroidism, unspecified   . Insomnia, unspecified   . Lumbago   . Nicotine dependence, unspecified, uncomplicated   . Nonscarring hair loss, unspecified   . Obesity, unspecified   . Pain in joint involving pelvic region and thigh   . Peripheral vascular disease, unspecified ([place])   . Thyroid disease   . Thyroiditis, unspecified     Past Surgical History:  Procedure Laterality Date  . CHOLECYSTECTOMY N/A 05/20/2017   Procedure: LAPAROSCOPIC CHOLECYSTECTOMY;  Surgeon: Aviva Signs, MD;  Location: [facility];  Service: General;  Laterality: N/A;  . REVISION TOTAL HIP ARTHROPLASTY  2010 2011   bilateral  . TUBAL LIGATION      Family History  Problem Relation Age of Onset  . Hypertension Mother   . Diabetes Mother   . Heart disease Father   . Hypertension Father     Social History   Socioeconomic History  . Marital status: Divorced    Spouse name: Not on file  . Number of children: Not on file  . Years of education: Not on file  . Highest education level: Not on file  Occupational History  .  Occupation: Disability    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 [employer] Use  . Smoking status: Former Smoker    Years: 40.00    Types: Cigarettes    Quit date: 03/17/2017    Years since quitting: 3.4  . Smokeless tobacco: Never Used  Vaping Use  . Vaping Use: Never used  Substance and Sexual Activity  . Alcohol use: No  . Drug use: No  . Sexual activity: Not Currently  Other Topics Concern  . Not on file  Social History Narrative  . Not on file   Social Determinants of Health   Financial Resource Strain: Not on file  Food Insecurity: Not on file  Transportation Needs: Not on file  Physical Activity: Not on file  Stress: Not on file  Social Connections: Not on file  Intimate Partner Violence: Not on file    ROS Review of Systems  Constitutional: Negative.   Eyes: Positive for visual disturbance.  Respiratory: Negative.   Cardiovascular: Negative.   Psychiatric/Behavioral: Positive for sleep disturbance. Negative for self-injury and suicidal ideas.    Objective:   Today's Vitals: BP (!) 176/96   Pulse 82   Temp 98.6 F (37 C)   Resp 20   Ht '5\' 5"'  (1.651 m)   Wt 231 lb (104.8 kg)   SpO2 92%   BMI 38.44 kg/m   Physical  Exam Constitutional:      Appearance: She is obese.  Cardiovascular:     Rate and Rhythm: Normal rate and regular rhythm.     Pulses: Normal pulses.     Heart sounds: Normal heart sounds.     Comments: Wears compression stockings, but no edema today Pulmonary:     Effort: Pulmonary effort is normal.     Breath sounds: Normal breath sounds.  Neurological:     Mental Status: She is alert.  Psychiatric:        Mood and Affect: Mood normal.        Behavior: Behavior normal.        Thought Content: Thought content normal.        Judgment: Judgment normal.     Assessment & Plan:   Problem List Items Addressed This Visit      Cardiovascular and Mediastinum   Peripheral vascular disease, unspecified ([place])    -wears compression stockings       Relevant Medications   olmesartan (BENICAR) 20 MG tablet   Hypertension associated with diabetes ([place])    -she has had blurred vision, pressure in her head, and facial flushing for about a week. -BP 176/96 -Rx. olmesartan      Relevant Medications   olmesartan (BENICAR) 20 MG tablet     Endocrine   Hypothyroidism, unspecified - Primary    -she has been taking levothyroxine 100 mcg daily -she has 3-4 days of medication left; will need refill base don results of labs      Relevant Orders   TSH + free T4     Other   Hyperlipidemia, unspecified    -no recent labs; will collect today      Relevant Medications   olmesartan (BENICAR) 20 MG tablet   Insomnia, unspecified    -may consider adding medication on at her physical -she does not want anything too sedating      Encounter to establish care    -have records in epic      Relevant Orders   CBC with Differential/Platelet   CMP14+EGFR   Hemoglobin A1c   Lipid Panel With LDL/HDL Ratio   Screening due    -will screen for HIV; HCV has been performed previously      Relevant Orders   Hemoglobin A1c   HIV Antibody (routine testing w rflx)      Outpatient Encounter Medications as of 08/21/2020  Medication Sig  . levothyroxine (SYNTHROID) 100 MCG tablet Take 1 tablet (100 mcg total) by mouth daily.  Marland Kitchen olmesartan (BENICAR) 20 MG tablet Take 1 tablet (20 mg total) by mouth daily.   No facility-administered encounter medications on file as of 08/21/2020.    Follow-up: Return in about 1 week (around 08/28/2020) for Physical Exam.   Noreene Larsson, NP

## 2020-08-21 NOTE — Assessment & Plan Note (Signed)
-  no recent labs -will collect today 

## 2020-08-21 NOTE — Assessment & Plan Note (Signed)
-  will screen for HIV; HCV has been performed previously

## 2020-08-21 NOTE — Assessment & Plan Note (Signed)
-  have records in epic

## 2020-08-21 NOTE — Assessment & Plan Note (Signed)
-  wears compression stockings

## 2020-08-21 NOTE — Patient Instructions (Signed)
I called in olmesartan to [facility] for oyur blood pressure. We will meet back up in 1 week for a physical.

## 2020-08-22 ENCOUNTER — Other Ambulatory Visit: Payer: Self-pay | Admitting: Nurse Practitioner

## 2020-08-22 LAB — CBC WITH DIFFERENTIAL/PLATELET
Basophils Absolute: 0.1 10*3/uL (ref 0.0–0.2)
Basos: 1 %
EOS (ABSOLUTE): 0.2 10*3/uL (ref 0.0–0.4)
Eos: 2 %
Hematocrit: 39.6 % (ref 34.0–46.6)
Hemoglobin: 13.8 g/dL (ref 11.1–15.9)
Immature Grans (Abs): 0 10*3/uL (ref 0.0–0.1)
Immature Granulocytes: 0 %
Lymphocytes Absolute: 2.7 10*3/uL (ref 0.7–3.1)
Lymphs: 34 %
MCH: 33.3 pg — ABNORMAL HIGH (ref 26.6–33.0)
MCHC: 34.8 g/dL (ref 31.5–35.7)
MCV: 96 fL (ref 79–97)
Monocytes Absolute: 0.6 10*3/uL (ref 0.1–0.9)
Monocytes: 8 %
Neutrophils Absolute: 4.3 10*3/uL (ref 1.4–7.0)
Neutrophils: 55 %
Platelets: 194 10*3/uL (ref 150–450)
RBC: 4.14 x10E6/uL (ref 3.77–5.28)
RDW: 15.7 % — ABNORMAL HIGH (ref 11.7–15.4)
WBC: 7.9 10*3/uL (ref 3.4–10.8)

## 2020-08-22 LAB — CMP14+EGFR
ALT: 39 IU/L — ABNORMAL HIGH (ref 0–32)
AST: 44 IU/L — ABNORMAL HIGH (ref 0–40)
Albumin/Globulin Ratio: 1.6 (ref 1.2–2.2)
Albumin: 4.8 g/dL (ref 3.8–4.8)
Alkaline Phosphatase: 56 IU/L (ref 44–121)
BUN/Creatinine Ratio: 12 (ref 12–28)
BUN: 12 mg/dL (ref 8–27)
Bilirubin Total: 0.9 mg/dL (ref 0.0–1.2)
CO2: 23 mmol/L (ref 20–29)
Calcium: 9.7 mg/dL (ref 8.7–10.3)
Chloride: 102 mmol/L (ref 96–106)
Creatinine, Ser: 1 mg/dL (ref 0.57–1.00)
GFR calc Af Amer: 70 mL/min/{1.73_m2} (ref >59)
GFR calc non Af Amer: 61 mL/min/{1.73_m2} (ref >59)
Globulin, Total: 3 g/dL (ref 1.5–4.5)
Glucose: 93 mg/dL (ref 65–99)
Potassium: 4.2 mmol/L (ref 3.5–5.2)
Sodium: 144 mmol/L (ref 134–144)
Total Protein: 7.8 g/dL (ref 6.0–8.5)

## 2020-08-22 LAB — HEMOGLOBIN A1C
Est. average glucose Bld gHb Est-mCnc: 123 mg/dL
Hgb A1c MFr Bld: 5.9 % — ABNORMAL HIGH (ref 4.8–5.6)

## 2020-08-22 LAB — LIPID PANEL WITH LDL/HDL RATIO
Cholesterol, Total: 230 mg/dL — ABNORMAL HIGH (ref 100–199)
HDL: 47 mg/dL (ref >39)
LDL Chol Calc (NIH): 161 mg/dL — ABNORMAL HIGH (ref 0–99)
LDL/HDL Ratio: 3.4 ratio — ABNORMAL HIGH (ref 0.0–3.2)
Triglycerides: 124 mg/dL (ref 0–149)
VLDL Cholesterol Cal: 22 mg/dL (ref 5–40)

## 2020-08-22 LAB — TSH+FREE T4
Free T4: 0.86 ng/dL (ref 0.82–1.77)
TSH: 70.1 u[IU]/mL — ABNORMAL HIGH (ref 0.450–4.500)

## 2020-08-22 LAB — HIV ANTIBODY (ROUTINE TESTING W REFLEX): HIV Screen 4th Generation wRfx: NONREACTIVE

## 2020-08-22 MED ORDER — LEVOTHYROXINE SODIUM 125 MCG PO TABS: 125.0000 ug | ORAL_TABLET | Freq: Every day | ORAL | 1 refills | Status: DC

## 2020-08-22 NOTE — Progress Notes (Signed)
Her A1c is in the prediabetic range, so no reason to add medications for that. Her LFTs have improved. Her bad cholesterol is elevated and we will talk about that at our next appointment.  Her TSH is really elevated at 70.1, so I increased her levothyroxine to 125 mcg daily, and that has been called in.

## 2020-09-01 ENCOUNTER — Encounter: Payer: Self-pay | Admitting: Nurse Practitioner

## 2020-09-01 ENCOUNTER — Ambulatory Visit (INDEPENDENT_AMBULATORY_CARE_PROVIDER_SITE_OTHER): Payer: Medicare HMO | Admitting: Nurse Practitioner

## 2020-09-01 ENCOUNTER — Other Ambulatory Visit: Payer: Self-pay

## 2020-09-01 VITALS — BP 104/82 | HR 78 | Temp 99.1°F | Resp 18 | Ht 65.0 in | Wt 234.0 lb

## 2020-09-01 DIAGNOSIS — E039 Hypothyroidism, unspecified: Secondary | ICD-10-CM

## 2020-09-01 DIAGNOSIS — I152 Hypertension secondary to endocrine disorders: Secondary | ICD-10-CM

## 2020-09-01 DIAGNOSIS — Z1231 Encounter for screening mammogram for malignant neoplasm of breast: Secondary | ICD-10-CM

## 2020-09-01 DIAGNOSIS — E1159 Type 2 diabetes mellitus with other circulatory complications: Secondary | ICD-10-CM | POA: Diagnosis not present

## 2020-09-01 DIAGNOSIS — E785 Hyperlipidemia, unspecified: Secondary | ICD-10-CM

## 2020-09-01 DIAGNOSIS — Z139 Encounter for screening, unspecified: Secondary | ICD-10-CM | POA: Diagnosis not present

## 2020-09-01 MED ORDER — EZETIMIBE 10 MG PO TABS
10.0000 mg | ORAL_TABLET | Freq: Every day | ORAL | 3 refills | Status: DC
Start: 2020-09-01 — End: 2021-06-23

## 2020-09-01 MED ORDER — LEVOTHYROXINE SODIUM 125 MCG PO TABS: 125.0000 ug | ORAL_TABLET | Freq: Every day | ORAL | 1 refills | Status: DC

## 2020-09-01 NOTE — Progress Notes (Signed)
Established Patient Office Visit  Subjective:  Patient ID: Misty Parker, female    DOB: November 03, 1960  Age: 60 y.o. MRN: [id number]  CC:  Chief Complaint  Patient presents with  . Annual Exam    HPI Misty Parker presents for physical exam. Her LFTs, cholesterol, and TSH are outside of the normal limits.  Past Medical History:  Diagnosis Date  . Adverse effect of unspecified drugs, medicaments and biological substances, initial encounter   . Allergic rhinitis due to pollen   . Allergy    seasonal/pollen  . Calculus of gallbladder without cholecystitis without obstruction   . Chest pain, unspecified   . Hyperlipidemia   . Hyperlipidemia, unspecified   . Hypokalemia   . Hypothyroidism   . Hypothyroidism, unspecified   . Insomnia, unspecified   . Lumbago   . Nicotine dependence, unspecified, uncomplicated   . Nonscarring hair loss, unspecified   . Obesity, unspecified   . Pain in joint involving pelvic region and thigh   . Peripheral vascular disease, unspecified (HCC)   . Thyroid disease   . Thyroiditis, unspecified     Past Surgical History:  Procedure Laterality Date  . CHOLECYSTECTOMY N/A 05/20/2017   Procedure: LAPAROSCOPIC CHOLECYSTECTOMY;  Surgeon: Franky Macho, MD;  Location: [facility];  Service: General;  Laterality: N/A;  . REVISION TOTAL HIP ARTHROPLASTY  2010 2011   bilateral  . TUBAL LIGATION      Family History  Problem Relation Age of Onset  . Hypertension Mother   . Diabetes Mother   . Heart disease Father   . Hypertension Father     Social History   Socioeconomic History  . Marital status: Divorced    Spouse name: Not on file  . Number of children: Not on file  . Years of education: Not on file  . Highest education level: Not on file  Occupational History  . Occupation: Disability    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 [employer]  Tobacco Use  . Smoking status: Former Smoker    Years: 40.00    Types: Cigarettes    Quit date: 03/17/2017     Years since quitting: 3.4  . Smokeless tobacco: Never Used  Vaping Use  . Vaping Use: Never used  Substance and Sexual Activity  . Alcohol use: No  . Drug use: No  . Sexual activity: Not Currently  Other Topics Concern  . Not on file  Social History Narrative  . Not on file   Social Determinants of Health   Financial Resource Strain: Not on file  Food Insecurity: Not on file  Transportation Needs: Not on file  Physical Activity: Not on file  Stress: Not on file  Social Connections: Not on file  Intimate Partner Violence: Not on file    Outpatient Medications Prior to Visit  Medication Sig Dispense Refill  . olmesartan (BENICAR) 20 MG tablet Take 1 tablet (20 mg total) by mouth daily. 30 tablet 1  . levothyroxine (SYNTHROID) 125 MCG tablet Take 1 tablet (125 mcg total) by mouth daily. 30 tablet 1   No facility-administered medications prior to visit.    No Known Allergies  ROS Review of Systems  Constitutional: Negative.   HENT: Negative.   Eyes: Negative.   Respiratory: Negative.   Cardiovascular: Negative.   Gastrointestinal: Negative.   Endocrine: Negative.   Genitourinary: Negative.   Musculoskeletal: Negative.   Skin: Negative.   Allergic/Immunologic: Negative.   Neurological: Negative.   Hematological: Negative.   Psychiatric/Behavioral: Negative.  Objective:    Physical Exam Constitutional:      Appearance: Normal appearance. She is obese.  HENT:     Head: Normocephalic and atraumatic.     Right Ear: Tympanic membrane, ear canal and external ear normal.     Left Ear: Tympanic membrane, ear canal and external ear normal.     Nose: Nose normal.     Mouth/Throat:     Mouth: Mucous membranes are moist.     Pharynx: Oropharynx is clear.     Comments: Wears upper dentures Eyes:     Extraocular Movements: Extraocular movements intact.     Conjunctiva/sclera: Conjunctivae normal.     Pupils: Pupils are equal, round, and reactive to light.   Cardiovascular:     Rate and Rhythm: Normal rate and regular rhythm.     Pulses: Normal pulses.     Heart sounds: Normal heart sounds.  Pulmonary:     Effort: Pulmonary effort is normal.     Breath sounds: Normal breath sounds.  Abdominal:     General: Abdomen is flat. Bowel sounds are normal.     Palpations: Abdomen is soft.  Musculoskeletal:        General: Normal range of motion.     Cervical back: Normal range of motion and neck supple.  Skin:    General: Skin is warm and dry.  Neurological:     General: No focal deficit present.     Mental Status: She is alert and oriented to person, place, and time.  Psychiatric:        Mood and Affect: Mood normal.        Behavior: Behavior normal.        Thought Content: Thought content normal.        Judgment: Judgment normal.     BP 104/82   Pulse 78   Temp 99.1 F (37.3 C)   Resp 18   Ht 5\' 5"  (1.651 m)   Wt 234 lb (106.1 kg)   SpO2 92%   BMI 38.94 kg/m  Wt Readings from Last 3 Encounters:  09/01/20 234 lb (106.1 kg)  08/21/20 231 lb (104.8 kg)  09/26/19 228 lb (103.4 kg)     Health Maintenance Due  Topic Date Due  . COLONOSCOPY (Pts 45-57yrs Insurance coverage will need to be confirmed)  Never done  . MAMMOGRAM  04/03/2016    There are no preventive care reminders to display for this patient.  Lab Results  Component Value Date   TSH 70.100 (H) 08/21/2020   Lab Results  Component Value Date   WBC 7.9 08/21/2020   HGB 13.8 08/21/2020   HCT 39.6 08/21/2020   MCV 96 08/21/2020   PLT 194 08/21/2020   Lab Results  Component Value Date   NA 144 08/21/2020   K 4.2 08/21/2020   CO2 23 08/21/2020   GLUCOSE 93 08/21/2020   BUN 12 08/21/2020   CREATININE 1.00 08/21/2020   BILITOT 0.9 08/21/2020   ALKPHOS 56 08/21/2020   AST 44 (H) 08/21/2020   ALT 39 (H) 08/21/2020   PROT 7.8 08/21/2020   ALBUMIN 4.8 08/21/2020   CALCIUM 9.7 08/21/2020   ANIONGAP 13 05/17/2017   Lab Results  Component Value Date    CHOL 230 (H) 08/21/2020   Lab Results  Component Value Date   HDL 47 08/21/2020   Lab Results  Component Value Date   LDLCALC 161 (H) 08/21/2020   Lab Results  Component Value Date   TRIG  124 08/21/2020   No results found for: CHOLHDL Lab Results  Component Value Date   HGBA1C 5.9 (H) 08/21/2020      Assessment & Plan:   Problem List Items Addressed This Visit      Cardiovascular and Mediastinum   Hypertension associated with diabetes (HCC)    -well controlled today -continue olmesartan      Relevant Medications   ezetimibe (ZETIA) 10 MG tablet     Endocrine   Hypothyroidism, unspecified    Lab Results  Component Value Date   TSH 70.100 (H) 08/21/2020  -levothyrosine was increased to 125 mcg from 100 as a result of these labs       Relevant Medications   levothyroxine (SYNTHROID) 125 MCG tablet   Other Relevant Orders   TSH + free T4     Other   Hyperlipidemia, unspecified     Lab Results  Component Value Date   CHOL 230 (H) 08/21/2020   HDL 47 08/21/2020   LDLCALC 161 (H) 08/21/2020   TRIG 124 08/21/2020   -LDL 161 -she states she is statin intolerant  -Rx. zetia      Relevant Medications   ezetimibe (ZETIA) 10 MG tablet   Screening due   Relevant Orders   Ambulatory referral to Gastroenterology   Ambulatory referral to Gynecology    Other Visit Diagnoses    Screening mammogram for breast cancer    -  Primary   Relevant Orders   MM Digital Screening      Meds ordered this encounter  Medications  . levothyroxine (SYNTHROID) 125 MCG tablet    Sig: Take 1 tablet (125 mcg total) by mouth daily.    Dispense:  30 tablet    Refill:  1    Please do not give Euthyrox.  Marland Kitchen ezetimibe (ZETIA) 10 MG tablet    Sig: Take 1 tablet (10 mg total) by mouth daily.    Dispense:  90 tablet    Refill:  3    Follow-up: Return in about 1 month (around 09/29/2020) for Lab f/u (thyroid).    Heather Roberts, NP

## 2020-09-01 NOTE — Assessment & Plan Note (Signed)
Lab Results  Component Value Date   TSH 70.100 (H) 08/21/2020  -levothyrosine was increased to 125 mcg from 100 as a result of these labs

## 2020-09-01 NOTE — Assessment & Plan Note (Signed)
-  well controlled today -continue olmesartan

## 2020-09-01 NOTE — Assessment & Plan Note (Addendum)
  Lab Results  Component Value Date   CHOL 230 (H) 08/21/2020   HDL 47 08/21/2020   LDLCALC 161 (H) 08/21/2020   TRIG 124 08/21/2020   -LDL 161 -she states she is statin intolerant  -Rx. zetia

## 2020-09-02 ENCOUNTER — Encounter (INDEPENDENT_AMBULATORY_CARE_PROVIDER_SITE_OTHER): Payer: Self-pay | Admitting: *Deleted

## 2020-09-26 DIAGNOSIS — E039 Hypothyroidism, unspecified: Secondary | ICD-10-CM | POA: Diagnosis not present

## 2020-09-27 LAB — TSH+FREE T4
Free T4: 1.49 ng/dL (ref 0.82–1.77)
TSH: 1.36 u[IU]/mL (ref 0.450–4.500)

## 2020-09-29 ENCOUNTER — Ambulatory Visit (INDEPENDENT_AMBULATORY_CARE_PROVIDER_SITE_OTHER): Payer: Medicare HMO | Admitting: Nurse Practitioner

## 2020-09-29 ENCOUNTER — Other Ambulatory Visit: Payer: Self-pay

## 2020-09-29 ENCOUNTER — Encounter: Payer: Self-pay | Admitting: Nurse Practitioner

## 2020-09-29 DIAGNOSIS — E039 Hypothyroidism, unspecified: Secondary | ICD-10-CM | POA: Diagnosis not present

## 2020-09-29 DIAGNOSIS — E1159 Type 2 diabetes mellitus with other circulatory complications: Secondary | ICD-10-CM | POA: Diagnosis not present

## 2020-09-29 DIAGNOSIS — I152 Hypertension secondary to endocrine disorders: Secondary | ICD-10-CM | POA: Diagnosis not present

## 2020-09-29 DIAGNOSIS — E785 Hyperlipidemia, unspecified: Secondary | ICD-10-CM

## 2020-09-29 MED ORDER — LEVOTHYROXINE SODIUM 125 MCG PO TABS: 125.0000 ug | ORAL_TABLET | Freq: Every day | ORAL | 1 refills | Status: DC

## 2020-09-29 MED ORDER — OLMESARTAN MEDOXOMIL 20 MG PO TABS: 20.0000 mg | ORAL_TABLET | Freq: Every day | ORAL | 1 refills | Status: DC

## 2020-09-29 NOTE — Assessment & Plan Note (Signed)
-  BP slightly elevated today -refilled olmesartan -she will start checking BP at home

## 2020-09-29 NOTE — Progress Notes (Signed)
Thyroid labs are perfect. No needs to call because she has appointment today.

## 2020-09-29 NOTE — Assessment & Plan Note (Signed)
-  takes zetia occasionally; she hasn't got that in her routine yet, but does take it some

## 2020-09-29 NOTE — Progress Notes (Signed)
Established Patient Office Visit  Subjective:  Patient ID: Misty Parker, female    DOB: 1960-12-13  Age: 60 y.o. MRN: [id number]  CC:  Chief Complaint  Patient presents with  . Hypothyroidism  . Hypertension    HPI Misty Parker presents for lab follow-up. At her last visit, her TSH was 70, so her levothyroxine was increased to 125 mcg from 100.  Past Medical History:  Diagnosis Date  . Adverse effect of unspecified drugs, medicaments and biological substances, initial encounter   . Allergic rhinitis due to pollen   . Allergy    seasonal/pollen  . Calculus of gallbladder without cholecystitis without obstruction   . Chest pain, unspecified   . Hyperlipidemia   . Hyperlipidemia, unspecified   . Hypokalemia   . Hypothyroidism   . Hypothyroidism, unspecified   . Insomnia, unspecified   . Lumbago   . Nicotine dependence, unspecified, uncomplicated   . Nonscarring hair loss, unspecified   . Obesity, unspecified   . Pain in joint involving pelvic region and thigh   . Peripheral vascular disease, unspecified ([place])   . Thyroid disease   . Thyroiditis, unspecified     Past Surgical History:  Procedure Laterality Date  . CHOLECYSTECTOMY N/A 05/20/2017   Procedure: LAPAROSCOPIC CHOLECYSTECTOMY;  Surgeon: Aviva Signs, MD;  Location: [facility];  Service: General;  Laterality: N/A;  . REVISION TOTAL HIP ARTHROPLASTY  2010 2011   bilateral  . TUBAL LIGATION      Family History  Problem Relation Age of Onset  . Hypertension Mother   . Diabetes Mother   . Heart disease Father   . Hypertension Father     Social History   Socioeconomic History  . Marital status: Divorced    Spouse name: Not on file  . Number of children: Not on file  . Years of education: Not on file  . Highest education level: Not on file  Occupational History  . Occupation: Disability    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 [employer] Use  . Smoking status: Former Smoker    Years: 40.00     Types: Cigarettes    Quit date: 03/17/2017    Years since quitting: 3.5  . Smokeless tobacco: Never Used  Vaping Use  . Vaping Use: Never used  Substance and Sexual Activity  . Alcohol use: No  . Drug use: No  . Sexual activity: Not Currently  Other Topics Concern  . Not on file  Social History Narrative  . Not on file   Social Determinants of Health   Financial Resource Strain: Not on file  Food Insecurity: Not on file  Transportation Needs: Not on file  Physical Activity: Not on file  Stress: Not on file  Social Connections: Not on file  Intimate Partner Violence: Not on file    Outpatient Medications Prior to Visit  Medication Sig Dispense Refill  . ezetimibe (ZETIA) 10 MG tablet Take 1 tablet (10 mg total) by mouth daily. 90 tablet 3  . levothyroxine (SYNTHROID) 125 MCG tablet Take 1 tablet (125 mcg total) by mouth daily. 30 tablet 1  . olmesartan (BENICAR) 20 MG tablet Take 1 tablet (20 mg total) by mouth daily. 30 tablet 1   No facility-administered medications prior to visit.    No Known Allergies  ROS Review of Systems  Constitutional: Negative.   Respiratory: Negative.   Cardiovascular: Negative.   Endocrine: Negative.   Psychiatric/Behavioral: Negative.       Objective:  Physical Exam Constitutional:      Appearance: Normal appearance.  Cardiovascular:     Rate and Rhythm: Normal rate and regular rhythm.     Pulses: Normal pulses.     Heart sounds: Normal heart sounds.  Pulmonary:     Breath sounds: Normal breath sounds.  Musculoskeletal:        General: Normal range of motion.  Neurological:     Mental Status: She is alert.  Psychiatric:        Mood and Affect: Mood normal.        Behavior: Behavior normal.        Thought Content: Thought content normal.        Judgment: Judgment normal.     BP (!) 149/83   Pulse 76   Temp 98.8 F (37.1 C)   Resp 18   Ht 5' 5" (1.651 m)   Wt 230 lb (104.3 kg)   SpO2 91%   BMI 38.27 kg/m  Wt  Readings from Last 3 Encounters:  09/29/20 230 lb (104.3 kg)  09/01/20 234 lb (106.1 kg)  08/21/20 231 lb (104.8 kg)     Health Maintenance Due  Topic Date Due  . COVID-19 Vaccine (1) Never done  . OPHTHALMOLOGY EXAM  Never done  . PAP SMEAR-Modifier  Never done  . COLONOSCOPY (Pts 45-33yr Insurance coverage will need to be confirmed)  Never done  . MAMMOGRAM  04/03/2016    There are no preventive care reminders to display for this patient.  Lab Results  Component Value Date   TSH 1.360 09/26/2020   Lab Results  Component Value Date   WBC 7.9 08/21/2020   HGB 13.8 08/21/2020   HCT 39.6 08/21/2020   MCV 96 08/21/2020   PLT 194 08/21/2020   Lab Results  Component Value Date   NA 144 08/21/2020   K 4.2 08/21/2020   CO2 23 08/21/2020   GLUCOSE 93 08/21/2020   BUN 12 08/21/2020   CREATININE 1.00 08/21/2020   BILITOT 0.9 08/21/2020   ALKPHOS 56 08/21/2020   AST 44 (H) 08/21/2020   ALT 39 (H) 08/21/2020   PROT 7.8 08/21/2020   ALBUMIN 4.8 08/21/2020   CALCIUM 9.7 08/21/2020   ANIONGAP 13 05/17/2017   Lab Results  Component Value Date   CHOL 230 (H) 08/21/2020   Lab Results  Component Value Date   HDL 47 08/21/2020   Lab Results  Component Value Date   LDLCALC 161 (H) 08/21/2020   Lab Results  Component Value Date   TRIG 124 08/21/2020   No results found for: CHOLHDL Lab Results  Component Value Date   HGBA1C 5.9 (H) 08/21/2020      Assessment & Plan:   Problem List Items Addressed This Visit      Cardiovascular and Mediastinum   Hypertension associated with diabetes ([place]    -BP slightly elevated today -refilled olmesartan -she will start checking BP at home      Relevant Medications   olmesartan (BENICAR) 20 MG tablet   Other Relevant Orders   CBC with Differential/Platelet   CMP14+EGFR     Endocrine   Hypothyroidism, unspecified    Lab Results  Component Value Date   TSH 1.360 09/26/2020   -refilled levothyroxine       Relevant Medications   levothyroxine (SYNTHROID) 125 MCG tablet   Other Relevant Orders   TSH + free T4     Other   Hyperlipidemia, unspecified    -takes zetia occasionally;  she hasn't got that in her routine yet, but does take it some      Relevant Medications   olmesartan (BENICAR) 20 MG tablet   Other Relevant Orders   CBC with Differential/Platelet   CMP14+EGFR   Lipid Panel With LDL/HDL Ratio      Meds ordered this encounter  Medications  . levothyroxine (SYNTHROID) 125 MCG tablet    Sig: Take 1 tablet (125 mcg total) by mouth daily.    Dispense:  90 tablet    Refill:  1    Please do not give Euthyrox.  Marland Kitchen olmesartan (BENICAR) 20 MG tablet    Sig: Take 1 tablet (20 mg total) by mouth daily.    Dispense:  90 tablet    Refill:  1    Follow-up: Return in about 10 weeks (around 12/08/2020) for Lab follow-up.    Noreene Larsson, NP

## 2020-09-29 NOTE — Assessment & Plan Note (Signed)
Lab Results  Component Value Date   TSH 1.360 09/26/2020   -refilled levothyroxine

## 2020-10-02 ENCOUNTER — Encounter: Payer: Self-pay | Admitting: Adult Health

## 2020-11-07 ENCOUNTER — Telehealth: Payer: Self-pay | Admitting: Nurse Practitioner

## 2020-11-07 NOTE — Telephone Encounter (Signed)
Tried calling patient to  schedule Medicare Annual Wellness Visit (AWV) either virtually or in office.  No answer   AWV-I PER [place] 09/10/2011  please schedule at anytime with [facility]  health coach  This should be a 40 minute visit.

## 2020-12-09 ENCOUNTER — Ambulatory Visit: Payer: Medicare HMO | Admitting: Nurse Practitioner

## 2020-12-22 ENCOUNTER — Ambulatory Visit (INDEPENDENT_AMBULATORY_CARE_PROVIDER_SITE_OTHER): Payer: Medicare HMO | Admitting: Nurse Practitioner

## 2020-12-22 ENCOUNTER — Encounter: Payer: Self-pay | Admitting: Nurse Practitioner

## 2020-12-22 ENCOUNTER — Other Ambulatory Visit: Payer: Self-pay

## 2020-12-22 DIAGNOSIS — E1159 Type 2 diabetes mellitus with other circulatory complications: Secondary | ICD-10-CM | POA: Diagnosis not present

## 2020-12-22 DIAGNOSIS — I152 Hypertension secondary to endocrine disorders: Secondary | ICD-10-CM

## 2020-12-22 DIAGNOSIS — E039 Hypothyroidism, unspecified: Secondary | ICD-10-CM | POA: Diagnosis not present

## 2020-12-22 DIAGNOSIS — E785 Hyperlipidemia, unspecified: Secondary | ICD-10-CM | POA: Diagnosis not present

## 2020-12-22 NOTE — Progress Notes (Signed)
Established Patient Office Visit  Subjective:  Patient ID: Misty Parker, female    DOB: 1961/05/24  Age: 60 y.o. MRN: [id number]  CC:  Chief Complaint  Patient presents with   Hypothyroidism    HPI [name] presents for lab follow-up.She didn't have fasting labs drawn prior to this visit.  She states that she stopped taking zetia because she started hurting.  She was having neck and upper back pain when taking zetia. She tried statins in the past, and those didn't work for her.  Past Medical History:  Diagnosis Date   Adverse effect of unspecified drugs, medicaments and biological substances, initial encounter    Allergic rhinitis due to pollen    Allergy    seasonal/pollen   Calculus of gallbladder without cholecystitis without obstruction    Chest pain, unspecified    Hyperlipidemia    Hyperlipidemia, unspecified    Hypokalemia    Hypothyroidism    Hypothyroidism, unspecified    Insomnia, unspecified    Lumbago    Nicotine dependence, unspecified, uncomplicated    Nonscarring hair loss, unspecified    Obesity, unspecified    Pain in joint involving pelvic region and thigh    Peripheral vascular disease, unspecified ([place])    Thyroid disease    Thyroiditis, unspecified     Past Surgical History:  Procedure Laterality Date   CHOLECYSTECTOMY N/A 05/20/2017   Procedure: LAPAROSCOPIC CHOLECYSTECTOMY;  Surgeon: Aviva Signs, MD;  Location: [facility];  Service: General;  Laterality: N/A;   [place] ARTHROPLASTY  2010 2011   bilateral   TUBAL LIGATION      Family History  Problem Relation Age of Onset   Hypertension Mother    Diabetes Mother    Heart disease Father    Hypertension Father     Social History   Socioeconomic History   Marital status: Divorced    Spouse name: Not on file   Number of children: Not on file   Years of education: Not on file   Highest education level: Not on file  Occupational History   Occupation: Disability     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 [employer] Use   Smoking status: Former    Years: 40.00    Pack years: 0.00    Types: Cigarettes    Quit date: 03/17/2017    Years since quitting: 3.7   Smokeless tobacco: Never  Vaping Use   Vaping Use: Never used  Substance and Sexual Activity   Alcohol use: No   Drug use: No   Sexual activity: Not Currently  Other Topics Concern   Not on file  Social History Narrative   Not on file   Social Determinants of Health   Financial Resource Strain: Not on file  Food Insecurity: Not on file  Transportation Needs: Not on file  Physical Activity: Not on file  Stress: Not on file  Social Connections: Not on file  Intimate Partner Violence: Not on file    Outpatient Medications Prior to Visit  Medication Sig Dispense Refill   ezetimibe (ZETIA) 10 MG tablet Take 1 tablet (10 mg total) by mouth daily. 90 tablet 3   levothyroxine (SYNTHROID) 125 MCG tablet Take 1 tablet (125 mcg total) by mouth daily. 90 tablet 1   olmesartan (BENICAR) 20 MG tablet Take 1 tablet (20 mg total) by mouth daily. 90 tablet 1   No facility-administered medications prior to visit.    No Known Allergies  ROS Review of  Systems    Objective:    Physical Exam  BP 131/71 (BP Location: Right Arm, Patient Position: Sitting, Cuff Size: Large)   Pulse 76   Temp 98.6 F (37 C) (Oral)   Resp 18   Ht _0  (1.651 m)   Wt 230 lb (104.3 kg)   SpO2 92%   BMI 38.27 kg/m  Wt Readings from Last 3 Encounters:  12/22/20 230 lb (104.3 kg)  09/29/20 230 lb (104.3 kg)  09/01/20 234 lb (106.1 kg)     Health Maintenance Due  Topic Date Due   COVID-19 Vaccine (1) Never done   OPHTHALMOLOGY EXAM  Never done   PAP SMEAR-Modifier  Never done   COLONOSCOPY (Pts 45-2yr Insurance coverage will need to be confirmed)  Never done   Zoster Vaccines- Shingrix (1 of 2) Never done   MAMMOGRAM  04/03/2016    There are no preventive care reminders to display for this patient.  Lab  Results  Component Value Date   TSH 1.360 09/26/2020   Lab Results  Component Value Date   WBC 7.9 08/21/2020   HGB 13.8 08/21/2020   HCT 39.6 08/21/2020   MCV 96 08/21/2020   PLT 194 08/21/2020   Lab Results  Component Value Date   NA 144 08/21/2020   K 4.2 08/21/2020   CO2 23 08/21/2020   GLUCOSE 93 08/21/2020   BUN 12 08/21/2020   CREATININE 1.00 08/21/2020   BILITOT 0.9 08/21/2020   ALKPHOS 56 08/21/2020   AST 44 (H) 08/21/2020   ALT 39 (H) 08/21/2020   PROT 7.8 08/21/2020   ALBUMIN 4.8 08/21/2020   CALCIUM 9.7 08/21/2020   ANIONGAP 13 05/17/2017   Lab Results  Component Value Date   CHOL 230 (H) 08/21/2020   Lab Results  Component Value Date   HDL 47 08/21/2020   Lab Results  Component Value Date   LDLCALC 161 (H) 08/21/2020   Lab Results  Component Value Date   TRIG 124 08/21/2020   No results found for: CHOLHDL Lab Results  Component Value Date   HGBA1C 5.9 (H) 08/21/2020      Assessment & Plan:   Problem List Items Addressed This Visit       Cardiovascular and Mediastinum   Hypertension associated with diabetes ([place]    -takes olmesartan BP Readings from Last 3 Encounters:  12/22/20 131/71  09/29/20 (!) 149/83  09/01/20 104/82   -well-controlled today       Relevant Orders   CBC with Differential/Platelet   CMP14+EGFR   Lipid Panel With LDL/HDL Ratio   Hemoglobin A1c     Endocrine   Hypothyroidism, unspecified    -checking thyroid panel -taking levothyroxine 125 mcg daily       Relevant Orders   TSH + free T4     Other   Hyperlipidemia, unspecified    -takes zetia -checking labs today       Relevant Orders   Lipid Panel With LDL/HDL Ratio   Hemoglobin A1c    No orders of the defined types were placed in this encounter.   Follow-up: Return in about 4 months (around 04/23/2021) for Lab follow-up (HTN, HLD, hypothyroidism).    JNoreene Larsson NP

## 2020-12-22 NOTE — Assessment & Plan Note (Signed)
-  checking thyroid panel -taking levothyroxine 125 mcg daily

## 2020-12-22 NOTE — Assessment & Plan Note (Signed)
-  takes zetia -checking labs today

## 2020-12-22 NOTE — Assessment & Plan Note (Addendum)
-  takes olmesartan BP Readings from Last 3 Encounters:  12/22/20 131/71  09/29/20 (!) 149/83  09/01/20 104/82   -well-controlled today

## 2020-12-22 NOTE — Patient Instructions (Signed)
Please have fasting labs drawn today.  For your next visit, please have fasting labs drawn 2-3 days prior to your appointment so we can discuss the results during your office visit.

## 2020-12-23 LAB — LIPID PANEL WITH LDL/HDL RATIO
Cholesterol, Total: 181 mg/dL (ref 100–199)
HDL: 41 mg/dL (ref >39)
LDL Chol Calc (NIH): 119 mg/dL — ABNORMAL HIGH (ref 0–99)
LDL/HDL Ratio: 2.9 ratio (ref 0.0–3.2)
Triglycerides: 117 mg/dL (ref 0–149)
VLDL Cholesterol Cal: 21 mg/dL (ref 5–40)

## 2020-12-23 LAB — CMP14+EGFR
ALT: 25 IU/L (ref 0–32)
AST: 28 IU/L (ref 0–40)
Albumin/Globulin Ratio: 2 (ref 1.2–2.2)
Albumin: 4.3 g/dL (ref 3.8–4.8)
Alkaline Phosphatase: 56 IU/L (ref 44–121)
BUN/Creatinine Ratio: 9 — ABNORMAL LOW (ref 12–28)
BUN: 9 mg/dL (ref 8–27)
Bilirubin Total: 0.7 mg/dL (ref 0.0–1.2)
CO2: 19 mmol/L — ABNORMAL LOW (ref 20–29)
Calcium: 8.9 mg/dL (ref 8.7–10.3)
Chloride: 106 mmol/L (ref 96–106)
Creatinine, Ser: 0.98 mg/dL (ref 0.57–1.00)
Globulin, Total: 2.2 g/dL (ref 1.5–4.5)
Glucose: 96 mg/dL (ref 65–99)
Potassium: 3.6 mmol/L (ref 3.5–5.2)
Sodium: 140 mmol/L (ref 134–144)
Total Protein: 6.5 g/dL (ref 6.0–8.5)
eGFR: 66 mL/min/{1.73_m2} (ref >59)

## 2020-12-23 LAB — CBC WITH DIFFERENTIAL/PLATELET
Basophils Absolute: 0 10*3/uL (ref 0.0–0.2)
Basos: 1 %
EOS (ABSOLUTE): 0.3 10*3/uL (ref 0.0–0.4)
Eos: 4 %
Hematocrit: 39.4 % (ref 34.0–46.6)
Hemoglobin: 13 g/dL (ref 11.1–15.9)
Immature Grans (Abs): 0 10*3/uL (ref 0.0–0.1)
Immature Granulocytes: 0 %
Lymphocytes Absolute: 2.5 10*3/uL (ref 0.7–3.1)
Lymphs: 36 %
MCH: 31.8 pg (ref 26.6–33.0)
MCHC: 33 g/dL (ref 31.5–35.7)
MCV: 96 fL (ref 79–97)
Monocytes Absolute: 0.5 10*3/uL (ref 0.1–0.9)
Monocytes: 8 %
Neutrophils Absolute: 3.5 10*3/uL (ref 1.4–7.0)
Neutrophils: 51 %
Platelets: 211 10*3/uL (ref 150–450)
RBC: 4.09 x10E6/uL (ref 3.77–5.28)
RDW: 15.5 % — ABNORMAL HIGH (ref 11.7–15.4)
WBC: 6.8 10*3/uL (ref 3.4–10.8)

## 2020-12-23 LAB — TSH+FREE T4
Free T4: 1.52 ng/dL (ref 0.82–1.77)
TSH: 0.667 u[IU]/mL (ref 0.450–4.500)

## 2020-12-23 NOTE — Progress Notes (Signed)
LDL, or bad cholesterol, improved to 119 from 161.  Liver enzymes, blood, and thyroid look great.

## 2021-03-27 ENCOUNTER — Telehealth: Payer: Self-pay | Admitting: Nurse Practitioner

## 2021-03-27 NOTE — Telephone Encounter (Signed)
  Left message for patient to call back and schedule Medicare Annual Wellness Visit (AWV) in office.   If unable to come into the office for AWV,  please offer to do virtually or by telephone.  No hx of AWV eligible for AWVI as of 09/10/2011  Please schedule at anytime with [facility] Health Advisor.      40 Minutes appointment   Any questions, please call me at [phone]   

## 2021-04-22 DIAGNOSIS — E1159 Type 2 diabetes mellitus with other circulatory complications: Secondary | ICD-10-CM | POA: Diagnosis not present

## 2021-04-22 DIAGNOSIS — E039 Hypothyroidism, unspecified: Secondary | ICD-10-CM | POA: Diagnosis not present

## 2021-04-22 DIAGNOSIS — E785 Hyperlipidemia, unspecified: Secondary | ICD-10-CM | POA: Diagnosis not present

## 2021-04-22 DIAGNOSIS — I152 Hypertension secondary to endocrine disorders: Secondary | ICD-10-CM | POA: Diagnosis not present

## 2021-04-23 ENCOUNTER — Ambulatory Visit: Payer: Medicare HMO | Admitting: Nurse Practitioner

## 2021-04-23 LAB — CBC WITH DIFFERENTIAL/PLATELET
Basophils Absolute: 0.1 10*3/uL (ref 0.0–0.2)
Basos: 1 %
EOS (ABSOLUTE): 0.3 10*3/uL (ref 0.0–0.4)
Eos: 3 %
Hematocrit: 40 % (ref 34.0–46.6)
Hemoglobin: 13.7 g/dL (ref 11.1–15.9)
Immature Grans (Abs): 0 10*3/uL (ref 0.0–0.1)
Immature Granulocytes: 0 %
Lymphocytes Absolute: 3.2 10*3/uL — ABNORMAL HIGH (ref 0.7–3.1)
Lymphs: 34 %
MCH: 32.5 pg (ref 26.6–33.0)
MCHC: 34.3 g/dL (ref 31.5–35.7)
MCV: 95 fL (ref 79–97)
Monocytes Absolute: 0.6 10*3/uL (ref 0.1–0.9)
Monocytes: 7 %
Neutrophils Absolute: 5.2 10*3/uL (ref 1.4–7.0)
Neutrophils: 55 %
Platelets: 226 10*3/uL (ref 150–450)
RBC: 4.21 x10E6/uL (ref 3.77–5.28)
RDW: 15.1 % (ref 11.7–15.4)
WBC: 9.4 10*3/uL (ref 3.4–10.8)

## 2021-04-23 LAB — LIPID PANEL WITH LDL/HDL RATIO
Cholesterol, Total: 198 mg/dL (ref 100–199)
HDL: 41 mg/dL (ref >39)
LDL Chol Calc (NIH): 137 mg/dL — ABNORMAL HIGH (ref 0–99)
LDL/HDL Ratio: 3.3 ratio — ABNORMAL HIGH (ref 0.0–3.2)
Triglycerides: 110 mg/dL (ref 0–149)
VLDL Cholesterol Cal: 20 mg/dL (ref 5–40)

## 2021-04-23 LAB — TSH+FREE T4
Free T4: 1.3 ng/dL (ref 0.82–1.77)
TSH: 1.13 u[IU]/mL (ref 0.450–4.500)

## 2021-04-23 LAB — CMP14+EGFR
ALT: 27 IU/L (ref 0–32)
AST: 32 IU/L (ref 0–40)
Albumin/Globulin Ratio: 1.9 (ref 1.2–2.2)
Albumin: 4.4 g/dL (ref 3.8–4.8)
Alkaline Phosphatase: 64 IU/L (ref 44–121)
BUN/Creatinine Ratio: 16 (ref 12–28)
BUN: 16 mg/dL (ref 8–27)
Bilirubin Total: 0.7 mg/dL (ref 0.0–1.2)
CO2: 22 mmol/L (ref 20–29)
Calcium: 9.3 mg/dL (ref 8.7–10.3)
Chloride: 101 mmol/L (ref 96–106)
Creatinine, Ser: 1 mg/dL (ref 0.57–1.00)
Globulin, Total: 2.3 g/dL (ref 1.5–4.5)
Glucose: 96 mg/dL (ref 70–99)
Potassium: 3.9 mmol/L (ref 3.5–5.2)
Sodium: 138 mmol/L (ref 134–144)
Total Protein: 6.7 g/dL (ref 6.0–8.5)
eGFR: 64 mL/min/{1.73_m2} (ref >59)

## 2021-04-23 LAB — HEMOGLOBIN A1C
Est. average glucose Bld gHb Est-mCnc: 126 mg/dL
Hgb A1c MFr Bld: 6 % — ABNORMAL HIGH (ref 4.8–5.6)

## 2021-05-12 ENCOUNTER — Other Ambulatory Visit: Payer: Self-pay

## 2021-05-12 ENCOUNTER — Telehealth: Payer: Self-pay

## 2021-05-12 MED ORDER — LEVOTHYROXINE SODIUM 125 MCG PO TABS: 125.0000 ug | ORAL_TABLET | Freq: Every day | ORAL | 1 refills | Status: DC

## 2021-05-12 NOTE — Telephone Encounter (Signed)
Patient called need med refill Levothyroxine 125 mcg send to [facility]

## 2021-05-29 ENCOUNTER — Telehealth: Payer: Self-pay | Admitting: Nurse Practitioner

## 2021-05-29 ENCOUNTER — Other Ambulatory Visit: Payer: Self-pay

## 2021-05-29 MED ORDER — OLMESARTAN MEDOXOMIL 20 MG PO TABS: 20.0000 mg | ORAL_TABLET | Freq: Every day | ORAL | 0 refills | Status: DC

## 2021-05-29 NOTE — Telephone Encounter (Signed)
Pt called in for refills on   olmesartan (BENICAR) 20 MG tablet   [facility] [place]

## 2021-05-29 NOTE — Telephone Encounter (Signed)
Med refilled.

## 2021-06-09 ENCOUNTER — Ambulatory Visit: Payer: Medicare HMO | Admitting: Nurse Practitioner

## 2021-06-23 ENCOUNTER — Ambulatory Visit (INDEPENDENT_AMBULATORY_CARE_PROVIDER_SITE_OTHER): Payer: Medicare HMO | Admitting: Nurse Practitioner

## 2021-06-23 ENCOUNTER — Encounter: Payer: Self-pay | Admitting: Nurse Practitioner

## 2021-06-23 ENCOUNTER — Other Ambulatory Visit: Payer: Self-pay

## 2021-06-23 VITALS — BP 173/84 | HR 81 | Resp 91 | Ht 65.0 in | Wt 224.1 lb

## 2021-06-23 DIAGNOSIS — Z23 Encounter for immunization: Secondary | ICD-10-CM

## 2021-06-23 DIAGNOSIS — R7303 Prediabetes: Secondary | ICD-10-CM | POA: Insufficient documentation

## 2021-06-23 DIAGNOSIS — E039 Hypothyroidism, unspecified: Secondary | ICD-10-CM

## 2021-06-23 DIAGNOSIS — E785 Hyperlipidemia, unspecified: Secondary | ICD-10-CM

## 2021-06-23 DIAGNOSIS — I159 Secondary hypertension, unspecified: Secondary | ICD-10-CM

## 2021-06-23 MED ORDER — OLMESARTAN MEDOXOMIL 40 MG PO TABS: 40.0000 mg | ORAL_TABLET | Freq: Every day | ORAL | 3 refills | Status: DC

## 2021-06-23 NOTE — Assessment & Plan Note (Signed)
-  flu shot administered today °

## 2021-06-23 NOTE — Assessment & Plan Note (Addendum)
BP Readings from Last 3 Encounters:  06/23/21 (!) 173/84  12/22/20 131/71  09/29/20 (!) 149/83   -she states that she was nervous today because she had a hard time getting here -INCREASE olmesartan to 40 mg (from 20) -f/u in 3 months; may return to clinic sooner if BP is consistently > 140/90

## 2021-06-23 NOTE — Assessment & Plan Note (Signed)
Lab Results  Component Value Date   HGBA1C 6.0 (H) 04/22/2021   -doing well; increase exercise to 30 min per day 5 times per week

## 2021-06-23 NOTE — Progress Notes (Signed)
Acute Office Visit  Subjective:    Patient ID: Misty Parker, female    DOB: 09/26/1960, 60 y.o.   MRN: [id number]  Chief Complaint  Patient presents with   Follow-up    4 month lab follow up/flu shot    HPI Patient is in today for lab follow-up for hypothyroidism, HTN, and HLD.  She states that she hasn't been taking zetia d/t body aches when she takes it.  Manual BP 162/92  Past Medical History:  Diagnosis Date   Adverse effect of unspecified drugs, medicaments and biological substances, initial encounter    Allergic rhinitis due to pollen    Allergy    seasonal/pollen   Calculus of gallbladder without cholecystitis without obstruction    Chest pain, unspecified    Hyperlipidemia    Hyperlipidemia, unspecified    Hypokalemia    Hypothyroidism    Hypothyroidism, unspecified    Insomnia, unspecified    Lumbago    Nicotine dependence, unspecified, uncomplicated    Nonscarring hair loss, unspecified    Obesity, unspecified    Pain in joint involving pelvic region and thigh    Peripheral vascular disease, unspecified ([place])    Thyroid disease    Thyroiditis, unspecified     Past Surgical History:  Procedure Laterality Date   CHOLECYSTECTOMY N/A 05/20/2017   Procedure: LAPAROSCOPIC CHOLECYSTECTOMY;  Surgeon: Aviva Signs, MD;  Location: [facility];  Service: General;  Laterality: N/A;   [place] ARTHROPLASTY  2010 2011   bilateral   TUBAL LIGATION      Family History  Problem Relation Age of Onset   Hypertension Mother    Diabetes Mother    Heart disease Father    Hypertension Father     Social History   Socioeconomic History   Marital status: Divorced    Spouse name: Not on file   Number of children: Not on file   Years of education: Not on file   Highest education level: Not on file  Occupational History   Occupation: Disability    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 [employer] Use   Smoking status: Former    Years: 40.00    Types:  Cigarettes    Quit date: 03/17/2017    Years since quitting: 4.2   Smokeless tobacco: Never  Vaping Use   Vaping Use: Never used  Substance and Sexual Activity   Alcohol use: No   Drug use: No   Sexual activity: Not Currently  Other Topics Concern   Not on file  Social History Narrative   Not on file   Social Determinants of Health   Financial Resource Strain: Not on file  Food Insecurity: Not on file  Transportation Needs: Not on file  Physical Activity: Not on file  Stress: Not on file  Social Connections: Not on file  Intimate Partner Violence: Not on file    Outpatient Medications Prior to Visit  Medication Sig Dispense Refill   levothyroxine (SYNTHROID) 125 MCG tablet Take 1 tablet (125 mcg total) by mouth daily. 90 tablet 1   olmesartan (BENICAR) 20 MG tablet Take 1 tablet (20 mg total) by mouth daily. 90 tablet 0   ezetimibe (ZETIA) 10 MG tablet Take 1 tablet (10 mg total) by mouth daily. (Patient not taking: Reported on 06/23/2021) 90 tablet 3   No facility-administered medications prior to visit.    No Known Allergies  Review of Systems     Objective:    Physical Exam  BP Marland Kitchen)  173/84    Pulse 81    Resp (!) 91    Ht $R'5\' 5"'HS$  (1.651 m)    Wt 224 lb 1.3 oz (101.6 kg)    BMI 37.29 kg/m  Wt Readings from Last 3 Encounters:  06/23/21 224 lb 1.3 oz (101.6 kg)  12/22/20 230 lb (104.3 kg)  09/29/20 230 lb (104.3 kg)    Health Maintenance Due  Topic Date Due   COVID-19 Vaccine (1) Never done   OPHTHALMOLOGY EXAM  Never done   PAP SMEAR-Modifier  Never done   COLONOSCOPY (Pts 45-72yrs Insurance coverage will need to be confirmed)  Never done   Zoster Vaccines- Shingrix (1 of 2) Never done   MAMMOGRAM  04/03/2016   Pneumococcal Vaccine 67-40 Years old (2 - PCV) 03/29/2017   INFLUENZA VACCINE  02/09/2021    There are no preventive care reminders to display for this patient.   Lab Results  Component Value Date   TSH 1.130 04/22/2021   Lab Results   Component Value Date   WBC 9.4 04/22/2021   HGB 13.7 04/22/2021   HCT 40.0 04/22/2021   MCV 95 04/22/2021   PLT 226 04/22/2021   Lab Results  Component Value Date   NA 138 04/22/2021   K 3.9 04/22/2021   CO2 22 04/22/2021   GLUCOSE 96 04/22/2021   BUN 16 04/22/2021   CREATININE 1.00 04/22/2021   BILITOT 0.7 04/22/2021   ALKPHOS 64 04/22/2021   AST 32 04/22/2021   ALT 27 04/22/2021   PROT 6.7 04/22/2021   ALBUMIN 4.4 04/22/2021   CALCIUM 9.3 04/22/2021   ANIONGAP 13 05/17/2017   EGFR 64 04/22/2021   Lab Results  Component Value Date   CHOL 198 04/22/2021   Lab Results  Component Value Date   HDL 41 04/22/2021   Lab Results  Component Value Date   LDLCALC 137 (H) 04/22/2021   Lab Results  Component Value Date   TRIG 110 04/22/2021   No results found for: CHOLHDL Lab Results  Component Value Date   HGBA1C 6.0 (H) 04/22/2021       Assessment & Plan:   Problem List Items Addressed This Visit       Cardiovascular and Mediastinum   High blood pressure - Primary    BP Readings from Last 3 Encounters:  06/23/21 (!) 173/84  12/22/20 131/71  09/29/20 (!) 149/83  -she states that she was nervous today because she had a hard time getting here -INCREASE olmesartan to 40 mg (from 20) -f/u in 3 months; may return to clinic sooner if BP is consistently > 140/90      Relevant Medications   olmesartan (BENICAR) 40 MG tablet   Other Relevant Orders   CBC with Differential/Platelet   CMP14+EGFR   Lipid Panel With LDL/HDL Ratio     Endocrine   Hypothyroidism, unspecified    Lab Results  Component Value Date   TSH 1.130 04/22/2021  -thyroid labs were great at last draw      Relevant Orders   TSH + free T4     Other   Hyperlipidemia, unspecified    Lab Results  Component Value Date   CHOL 198 04/22/2021   HDL 41 04/22/2021   LDLCALC 137 (H) 04/22/2021   TRIG 110 04/22/2021  -she is statin intolerant and she states that she had myalgias with zetia as  well -we discussed PCSK-9 inhibitors and she doesn't want to start one at this time -will monitor lipids routinely,  and may re-evaluate in the future      Relevant Medications   olmesartan (BENICAR) 40 MG tablet   Other Relevant Orders   Lipid Panel With LDL/HDL Ratio   Immunization due    -flu shot administered today      Prediabetes    Lab Results  Component Value Date   HGBA1C 6.0 (H) 04/22/2021  -doing well; increase exercise to 30 min per day 5 times per week      Relevant Orders   CBC with Differential/Platelet   CMP14+EGFR   Lipid Panel With LDL/HDL Ratio   Hemoglobin A1c     Meds ordered this encounter  Medications   olmesartan (BENICAR) 40 MG tablet    Sig: Take 1 tablet (40 mg total) by mouth daily.    Dispense:  90 tablet    Refill:  [place], NP

## 2021-06-23 NOTE — Assessment & Plan Note (Signed)
Lab Results  Component Value Date   TSH 1.130 04/22/2021   -thyroid labs were great at last draw

## 2021-06-23 NOTE — Patient Instructions (Signed)
Please have fasting labs drawn 2-3 days prior to your appointment so we can discuss the results during your office visit. ° °I will be moving to [facility] located at [address], [place], [place] [address] effective Jul 12, 2021. °If you would like to establish care with [facility] please call [phone]. °

## 2021-06-23 NOTE — Assessment & Plan Note (Signed)
Lab Results  Component Value Date   CHOL 198 04/22/2021   HDL 41 04/22/2021   LDLCALC 137 (H) 04/22/2021   TRIG 110 04/22/2021   -she is statin intolerant and she states that she had myalgias with zetia as well -we discussed PCSK-9 inhibitors and she doesn't want to start one at this time -will monitor lipids routinely, and may re-evaluate in the future

## 2021-07-27 ENCOUNTER — Encounter: Payer: Self-pay | Admitting: Internal Medicine

## 2021-09-21 ENCOUNTER — Ambulatory Visit: Payer: Medicare HMO | Admitting: Internal Medicine

## 2021-09-29 DIAGNOSIS — I159 Secondary hypertension, unspecified: Secondary | ICD-10-CM | POA: Diagnosis not present

## 2021-09-29 DIAGNOSIS — R7303 Prediabetes: Secondary | ICD-10-CM | POA: Diagnosis not present

## 2021-09-29 DIAGNOSIS — E785 Hyperlipidemia, unspecified: Secondary | ICD-10-CM | POA: Diagnosis not present

## 2021-09-29 DIAGNOSIS — E039 Hypothyroidism, unspecified: Secondary | ICD-10-CM | POA: Diagnosis not present

## 2021-09-30 ENCOUNTER — Ambulatory Visit: Payer: Medicare HMO | Admitting: Internal Medicine

## 2021-09-30 LAB — CMP14+EGFR
ALT: 21 IU/L (ref 0–32)
AST: 31 IU/L (ref 0–40)
Albumin/Globulin Ratio: 1.7 (ref 1.2–2.2)
Albumin: 4.5 g/dL (ref 3.8–4.8)
Alkaline Phosphatase: 69 IU/L (ref 44–121)
BUN/Creatinine Ratio: 12 (ref 12–28)
BUN: 12 mg/dL (ref 8–27)
Bilirubin Total: 0.8 mg/dL (ref 0.0–1.2)
CO2: 21 mmol/L (ref 20–29)
Calcium: 9.6 mg/dL (ref 8.7–10.3)
Chloride: 105 mmol/L (ref 96–106)
Creatinine, Ser: 0.98 mg/dL (ref 0.57–1.00)
Globulin, Total: 2.6 g/dL (ref 1.5–4.5)
Glucose: 103 mg/dL — ABNORMAL HIGH (ref 70–99)
Potassium: 4.1 mmol/L (ref 3.5–5.2)
Sodium: 145 mmol/L — ABNORMAL HIGH (ref 134–144)
Total Protein: 7.1 g/dL (ref 6.0–8.5)
eGFR: 65 mL/min/{1.73_m2} (ref >59)

## 2021-09-30 LAB — HEMOGLOBIN A1C
Est. average glucose Bld gHb Est-mCnc: 123 mg/dL
Hgb A1c MFr Bld: 5.9 % — ABNORMAL HIGH (ref 4.8–5.6)

## 2021-09-30 LAB — CBC WITH DIFFERENTIAL/PLATELET
Basophils Absolute: 0.1 10*3/uL (ref 0.0–0.2)
Basos: 1 %
EOS (ABSOLUTE): 0.2 10*3/uL (ref 0.0–0.4)
Eos: 3 %
Hematocrit: 41.8 % (ref 34.0–46.6)
Hemoglobin: 14.1 g/dL (ref 11.1–15.9)
Immature Grans (Abs): 0 10*3/uL (ref 0.0–0.1)
Immature Granulocytes: 0 %
Lymphocytes Absolute: 3.1 10*3/uL (ref 0.7–3.1)
Lymphs: 36 %
MCH: 31.8 pg (ref 26.6–33.0)
MCHC: 33.7 g/dL (ref 31.5–35.7)
MCV: 94 fL (ref 79–97)
Monocytes Absolute: 0.6 10*3/uL (ref 0.1–0.9)
Monocytes: 8 %
Neutrophils Absolute: 4.4 10*3/uL (ref 1.4–7.0)
Neutrophils: 52 %
Platelets: 240 10*3/uL (ref 150–450)
RBC: 4.44 x10E6/uL (ref 3.77–5.28)
RDW: 14.9 % (ref 11.7–15.4)
WBC: 8.4 10*3/uL (ref 3.4–10.8)

## 2021-09-30 LAB — LIPID PANEL WITH LDL/HDL RATIO
Cholesterol, Total: 192 mg/dL (ref 100–199)
HDL: 43 mg/dL (ref >39)
LDL Chol Calc (NIH): 128 mg/dL — ABNORMAL HIGH (ref 0–99)
LDL/HDL Ratio: 3 ratio (ref 0.0–3.2)
Triglycerides: 116 mg/dL (ref 0–149)
VLDL Cholesterol Cal: 21 mg/dL (ref 5–40)

## 2021-09-30 LAB — TSH+FREE T4
Free T4: 1.2 ng/dL (ref 0.82–1.77)
TSH: 3.02 u[IU]/mL (ref 0.450–4.500)

## 2021-10-08 ENCOUNTER — Ambulatory Visit (INDEPENDENT_AMBULATORY_CARE_PROVIDER_SITE_OTHER): Payer: Medicare HMO

## 2021-10-08 DIAGNOSIS — Z0000 Encounter for general adult medical examination without abnormal findings: Secondary | ICD-10-CM | POA: Diagnosis not present

## 2021-10-08 DIAGNOSIS — Z1231 Encounter for screening mammogram for malignant neoplasm of breast: Secondary | ICD-10-CM

## 2021-10-08 DIAGNOSIS — Z780 Asymptomatic menopausal state: Secondary | ICD-10-CM | POA: Diagnosis not present

## 2021-10-08 NOTE — Patient Instructions (Signed)
?  Misty Parker , ?Thank you for taking time to come for your Medicare Wellness Visit. I appreciate your ongoing commitment to your health goals. Please review the following plan we discussed and let me know if I can assist you in the future.  ? ?These are the goals we discussed: ? Goals   ? ?  Patient Stated   ?  Patient states that she would love to get some weight off, but with her tyroid issues it is impossible. ?  ? ?  ?  ?This is a list of the screening recommended for you and due dates:  ?Health Maintenance  ?Topic Date Due  ? COVID-19 Vaccine (1) Never done  ? Complete foot exam   Never done  ? Eye exam for diabetics  Never done  ? Tetanus Vaccine  Never done  ? Pap Smear  Never done  ? Colon Cancer Screening  Never done  ? Zoster (Shingles) Vaccine (1 of 2) Never done  ? Mammogram  04/03/2016  ? Hemoglobin A1C  04/01/2022  ? Flu Shot  Completed  ? Hepatitis C Screening: USPSTF Recommendation to screen - Ages 55-79 yo.  Completed  ? HIV Screening  Completed  ? HPV Vaccine  Aged Out  ?  ?

## 2021-10-08 NOTE — Progress Notes (Signed)
? ?Subjective:  ? Misty Parker is a 61 y.o. female who presents for Medicare Annual (Subsequent) preventive examination. ?I connected with  Henrene Dodge on 10/08/21 by a audio enabled telemedicine application and verified that I am speaking with the correct person using two identifiers. ? ?Patient Location: Home ? ?Provider Location: Office/Clinic ? ?I discussed the limitations of evaluation and management by telemedicine. The patient expressed understanding and agreed to proceed.  ?Review of Systems    ? ?  ? ?   ?Objective:  ?  ?There were no vitals filed for this visit. ?There is no height or weight on file to calculate BMI. ? ? ?  05/20/2017  ?  7:09 AM 05/17/2017  ?  2:34 PM 04/14/2017  ?  4:41 PM  ?Advanced Directives  ?Does Patient Have a Medical Advance Directive? No No No  ?Would patient like information on creating a medical advance directive? No - Patient declined No - Patient declined   ? ? ?Current Medications (verified) ?Outpatient Encounter Medications as of 10/08/2021  ?Medication Sig  ? levothyroxine (SYNTHROID) 125 MCG tablet Take 1 tablet (125 mcg total) by mouth daily.  ? olmesartan (BENICAR) 40 MG tablet Take 1 tablet (40 mg total) by mouth daily.  ? ?No facility-administered encounter medications on file as of 10/08/2021.  ? ? ?Allergies (verified) ?Patient has no known allergies.  ? ?History: ?Past Medical History:  ?Diagnosis Date  ? Adverse effect of unspecified drugs, medicaments and biological substances, initial encounter   ? Allergic rhinitis due to pollen   ? Allergy   ? seasonal/pollen  ? Calculus of gallbladder without cholecystitis without obstruction   ? Chest pain, unspecified   ? Hyperlipidemia   ? Hyperlipidemia, unspecified   ? Hypokalemia   ? Hypothyroidism   ? Hypothyroidism, unspecified   ? Insomnia, unspecified   ? Lumbago   ? Nicotine dependence, unspecified, uncomplicated   ? Nonscarring hair loss, unspecified   ? Obesity, unspecified   ? Pain in joint involving  pelvic region and thigh   ? Peripheral vascular disease, unspecified ([facility])   ? Thyroid disease   ? Thyroiditis, unspecified   ? ?Past Surgical History:  ?Procedure Laterality Date  ? CHOLECYSTECTOMY N/A 05/20/2017  ? Procedure: LAPAROSCOPIC CHOLECYSTECTOMY;  Surgeon: Franky Macho, MD;  Location: [facility];  Service: General;  Laterality: N/A;  ? REVISION TOTAL HIP ARTHROPLASTY  2010 2011  ? bilateral  ? TUBAL LIGATION    ? ?Family History  ?Problem Relation Age of Onset  ? Hypertension Mother   ? Diabetes Mother   ? Heart disease Father   ? Hypertension Father   ? ?Social History  ? ?Socioeconomic History  ? Marital status: Divorced  ?  Spouse name: Not on file  ? Number of children: Not on file  ? Years of education: Not on file  ? Highest education level: Not on file  ?Occupational History  ? Occupation: Disability  ?  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 [employer]  ?Tobacco Use  ? Smoking status: Former  ?  Years: 40.00  ?  Types: Cigarettes  ?  Quit date: 03/17/2017  ?  Years since quitting: 4.5  ? Smokeless tobacco: Never  ?Vaping Use  ? Vaping Use: Never used  ?Substance and Sexual Activity  ? Alcohol use: No  ? Drug use: No  ? Sexual activity: Not Currently  ?Other Topics Concern  ? Not on file  ?Social History Narrative  ? Not on file  ? ?  Social Determinants of Health  ? ?Financial Resource Strain: Not on file  ?Food Insecurity: Not on file  ?Transportation Needs: Not on file  ?Physical Activity: Not on file  ?Stress: Not on file  ?Social Connections: Not on file  ? ? ?Tobacco Counseling ?Counseling given: Not Answered ? ? ?Clinical Intake: ? ?  ? ?  ? ?  ? ?  ? ?  ? ?Diabetic?no ? ?  ? ?  ? ? ?Activities of Daily Living ?   ? View : No data to display.  ?  ?  ?  ? ? ?Patient Care Team: ?Anabel Halon, MD as PCP - General (Internal Medicine) ? ?Indicate any recent Medical Services you may have received from other than [facility] providers in the past year (date may be approximate). ? ?   ?Assessment:  ? This is a routine wellness  examination for Misty Parker. ? ?Hearing/Vision screen ?No results found. ? ?Dietary issues and exercise activities discussed: ?  ? ? Goals Addressed   ?None ?  ? ?Depression Screen ? ?  06/23/2021  ?  3:30 PM 12/22/2020  ?  1:51 PM 09/29/2020  ?  3:12 PM 09/01/2020  ?  2:45 PM 08/21/2020  ?  4:10 PM 09/26/2019  ?  3:20 PM  ?PHQ 2/9 Scores  ?PHQ - 2 Score 0 0 0 0 0 0  ?  ?Fall Risk ? ?  06/23/2021  ?  3:29 PM 12/22/2020  ?  1:51 PM 09/29/2020  ?  3:12 PM 09/01/2020  ?  2:45 PM 08/21/2020  ?  4:10 PM  ?Fall Risk   ?Falls in the past year? 0 0 0 0 0  ?Number falls in past yr: 0 0 0 0 0  ?Injury with Fall? 0 0 0 0 0  ?Risk for fall due to : No Fall Risks No Fall Risks No Fall Risks No Fall Risks No Fall Risks  ?Follow up Falls evaluation completed Falls evaluation completed Falls evaluation completed Falls evaluation completed Falls evaluation completed  ? ? ?FALL RISK PREVENTION PERTAINING TO THE HOME: ? ?Any stairs in or around the home? Yes  ?If so, are there any without handrails? No  ?Home free of loose throw rugs in walkways, pet beds, electrical cords, etc? Yes  ?Adequate lighting in your home to reduce risk of falls? Yes  ? ?ASSISTIVE DEVICES UTILIZED TO PREVENT FALLS: ? ?Life alert? No  ?Use of a cane, walker or w/c? No  ?Grab bars in the bathroom? No  ?Shower chair or bench in shower? No  ?Elevated toilet seat or a handicapped toilet? No  ? ?TIMED UP AND GO: ? ?Was the test performed? No .  ?Length of time to ambulate 10 feet:  sec.  ? ? ? ?Cognitive Function: ?  ?  ?  ? ?Immunizations ?Immunization History  ?Administered Date(s) Administered  ? Influenza Split 03/29/2016, 04/28/2017, 03/26/2019  ? Influenza,inj,Quad PF,6+ Mos 06/23/2021  ? Influenza-Unspecified 06/26/2014  ? Pneumococcal Polysaccharide-23 03/29/2016  ? ? ?TDAP status: Due, Education has been provided regarding the importance of this vaccine. Advised may receive this vaccine at local pharmacy or Health Dept. Aware to provide a copy of the vaccination  record if obtained from local pharmacy or Health Dept. Verbalized acceptance and understanding. ? ?Flu Vaccine status: Up to date ? ?Pneumococcal vaccine status: Up to date ? ?Covid-19 vaccine status: Declined, Education has been provided regarding the importance of this vaccine but patient still declined. Advised may receive this vaccine  at local pharmacy or Health Dept.or vaccine clinic. Aware to provide a copy of the vaccination record if obtained from local pharmacy or Health Dept. Verbalized acceptance and understanding. ? ?Qualifies for Shingles Vaccine? Yes   ?Zostavax completed No   ?Shingrix Completed?: No.    Education has been provided regarding the importance of this vaccine. Patient has been advised to call insurance company to determine out of pocket expense if they have not yet received this vaccine. Advised may also receive vaccine at local pharmacy or Health Dept. Verbalized acceptance and understanding. ? ?Screening Tests ?Health Maintenance  ?Topic Date Due  ? COVID-19 Vaccine (1) Never done  ? FOOT EXAM  Never done  ? OPHTHALMOLOGY EXAM  Never done  ? TETANUS/TDAP  Never done  ? PAP SMEAR-Modifier  Never done  ? COLONOSCOPY (Pts 45-5883yrs Insurance coverage will need to be confirmed)  Never done  ? Zoster Vaccines- Shingrix (1 of 2) Never done  ? MAMMOGRAM  04/03/2016  ? HEMOGLOBIN A1C  04/01/2022  ? INFLUENZA VACCINE  Completed  ? Hepatitis C Screening  Completed  ? HIV Screening  Completed  ? HPV VACCINES  Aged Out  ? ? ?Health Maintenance ? ?Health Maintenance Due  ?Topic Date Due  ? COVID-19 Vaccine (1) Never done  ? FOOT EXAM  Never done  ? OPHTHALMOLOGY EXAM  Never done  ? TETANUS/TDAP  Never done  ? PAP SMEAR-Modifier  Never done  ? COLONOSCOPY (Pts 45-2883yrs Insurance coverage will need to be confirmed)  Never done  ? Zoster Vaccines- Shingrix (1 of 2) Never done  ? MAMMOGRAM  04/03/2016  ? ? ? ? ?Mammogram status: Ordered 10/08/21. Pt provided with contact info and advised to call to  schedule appt.  ? ?Bone Density status: Ordered 10/08/21. Pt provided with contact info and advised to call to schedule appt. ? ?Lung Cancer Screening: (Low Dose CT Chest recommended if Age 86-80 years, 30 pack-year c

## 2021-11-10 ENCOUNTER — Ambulatory Visit: Payer: Medicare HMO | Admitting: Internal Medicine

## 2021-11-12 ENCOUNTER — Ambulatory Visit (INDEPENDENT_AMBULATORY_CARE_PROVIDER_SITE_OTHER): Payer: Medicare HMO | Admitting: Internal Medicine

## 2021-11-12 ENCOUNTER — Other Ambulatory Visit: Payer: Self-pay | Admitting: *Deleted

## 2021-11-12 ENCOUNTER — Encounter: Payer: Self-pay | Admitting: Internal Medicine

## 2021-11-12 VITALS — BP 138/86 | HR 77 | Resp 18 | Ht 65.0 in | Wt 225.6 lb

## 2021-11-12 DIAGNOSIS — I159 Secondary hypertension, unspecified: Secondary | ICD-10-CM

## 2021-11-12 DIAGNOSIS — Z0001 Encounter for general adult medical examination with abnormal findings: Secondary | ICD-10-CM | POA: Insufficient documentation

## 2021-11-12 DIAGNOSIS — Z1211 Encounter for screening for malignant neoplasm of colon: Secondary | ICD-10-CM | POA: Diagnosis not present

## 2021-11-12 DIAGNOSIS — E039 Hypothyroidism, unspecified: Secondary | ICD-10-CM | POA: Diagnosis not present

## 2021-11-12 DIAGNOSIS — E782 Mixed hyperlipidemia: Secondary | ICD-10-CM | POA: Diagnosis not present

## 2021-11-12 DIAGNOSIS — R7303 Prediabetes: Secondary | ICD-10-CM

## 2021-11-12 DIAGNOSIS — I10 Essential (primary) hypertension: Secondary | ICD-10-CM | POA: Diagnosis not present

## 2021-11-12 MED ORDER — LEVOTHYROXINE SODIUM 125 MCG PO TABS: 125.0000 ug | ORAL_TABLET | Freq: Every day | ORAL | 1 refills | Status: DC

## 2021-11-12 MED ORDER — OLMESARTAN MEDOXOMIL 40 MG PO TABS: 40.0000 mg | ORAL_TABLET | Freq: Every day | ORAL | 3 refills | Status: DC

## 2021-11-12 NOTE — Assessment & Plan Note (Signed)
Lab Results  ?Component Value Date  ? TSH 3.020 09/29/2021  ? ?On Levothyroxine 125 mcg QD ?

## 2021-11-12 NOTE — Patient Instructions (Signed)
Please continue taking medications as prescribed.  Please continue to follow low carb diet and perform moderate exercise/walking at least 150 mins/week. 

## 2021-11-12 NOTE — Assessment & Plan Note (Addendum)
BMI Readings from Last 3 Encounters:  ?11/12/21 37.54 kg/m?  ?06/23/21 37.29 kg/m?  ?12/22/20 38.27 kg/m?  ? ?With HTN, hypothyroidism and prediabetes ?Diet modification and moderate exercise/walking advised ?

## 2021-11-12 NOTE — Assessment & Plan Note (Signed)
BP Readings from Last 1 Encounters:  ?11/12/21 138/86  ? ?Well-controlled with Olmesartan ?Counseled for compliance with the medications ?Advised DASH diet and moderate exercise/walking, at least 150 mins/week ? ?

## 2021-11-12 NOTE — Progress Notes (Signed)
? ?Established Patient Office Visit ? ?Subjective:  ?Patient ID: Misty Parker, female    DOB: 12-04-1960  Age: 61 y.o. MRN: [id number] ? ?CC:  ?Chief Complaint  ?Patient presents with  ? Follow-up  ?  3 month follow up   ? ? ?HPI ?Misty Parker is a 61 y.o. female with past medical history of HTN and hypothyroidism who presents for f/u of her chronic medical conditions. ? ?HTN: BP is well-controlled. Takes medications regularly. Patient denies headache, dizziness, chest pain, dyspnea or palpitations. ? ?Hypothyroidism: She is on levothyroxine currently.  She denies any recent change in weight or appetite. ? ?She has been trying to follow low-carb diet, but has had difficulty losing weight.  She is encouraged to continue to follow Mediterranean diet and perform moderate exercise/walking as tolerated. ? ?Past Medical History:  ?Diagnosis Date  ? Adverse effect of unspecified drugs, medicaments and biological substances, initial encounter   ? Allergic rhinitis due to pollen   ? Allergy   ? seasonal/pollen  ? Calculus of gallbladder without cholecystitis without obstruction   ? Chest pain, unspecified   ? Hyperlipidemia   ? Hyperlipidemia, unspecified   ? Hypokalemia   ? Hypothyroidism   ? Hypothyroidism, unspecified   ? Insomnia, unspecified   ? Lumbago   ? Nicotine dependence, unspecified, uncomplicated   ? Nonscarring hair loss, unspecified   ? Obesity, unspecified   ? Pain in joint involving pelvic region and thigh   ? Peripheral vascular disease, unspecified ([place])   ? Thyroid disease   ? Thyroiditis, unspecified   ? ? ?Past Surgical History:  ?Procedure Laterality Date  ? CHOLECYSTECTOMY N/A 05/20/2017  ? Procedure: LAPAROSCOPIC CHOLECYSTECTOMY;  Surgeon: Aviva Signs, MD;  Location: [facility];  Service: General;  Laterality: N/A;  ? [place] ARTHROPLASTY  2010 2011  ? bilateral  ? TUBAL LIGATION    ? ? ?Family History  ?Problem Relation Age of Onset  ? Hypertension Mother   ? Diabetes Mother   ?  Heart disease Father   ? Hypertension Father   ? ? ?Social History  ? ?Socioeconomic History  ? Marital status: Divorced  ?  Spouse name: Not on file  ? Number of children: Not on file  ? Years of education: Not on file  ? Highest education level: Not on file  ?Occupational History  ? Occupation: Disability  ?  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 [employer]  ?Tobacco Use  ? Smoking status: Former  ?  Years: 40.00  ?  Types: Cigarettes  ?  Quit date: 03/17/2017  ?  Years since quitting: 4.6  ? Smokeless tobacco: Never  ?Vaping Use  ? Vaping Use: Never used  ?Substance and Sexual Activity  ? Alcohol use: No  ? Drug use: No  ? Sexual activity: Not Currently  ?Other Topics Concern  ? Not on file  ?Social History Narrative  ? Not on file  ? ?Social Determinants of Health  ? ?Financial Resource Strain: Not on file  ?Food Insecurity: Not on file  ?Transportation Needs: Not on file  ?Physical Activity: Not on file  ?Stress: Not on file  ?Social Connections: Not on file  ?Intimate Partner Violence: Not on file  ? ? ?Outpatient Medications Prior to Visit  ?Medication Sig Dispense Refill  ? levothyroxine (SYNTHROID) 125 MCG tablet Take 1 tablet (125 mcg total) by mouth daily. 90 tablet 1  ? olmesartan (BENICAR) 40 MG tablet Take 1 tablet (40 mg total) by mouth daily.  90 tablet 3  ? ?No facility-administered medications prior to visit.  ? ? ?No Known Allergies ? ?ROS ?Review of Systems  ?Constitutional:  Negative for chills and fever.  ?HENT:  Negative for congestion, sinus pressure, sinus pain and sore throat.   ?Eyes:  Negative for pain and discharge.  ?Respiratory:  Negative for cough and shortness of breath.   ?Cardiovascular:  Negative for chest pain and palpitations.  ?Gastrointestinal:  Negative for abdominal pain, constipation, diarrhea, nausea and vomiting.  ?Endocrine: Negative for polydipsia and polyuria.  ?Genitourinary:  Negative for dysuria and hematuria.  ?Musculoskeletal:  Negative for neck pain and neck stiffness.  ?Skin:   Negative for rash.  ?Neurological:  Negative for dizziness and weakness.  ?Psychiatric/Behavioral:  Negative for agitation and behavioral problems.   ? ?  ?Objective:  ?  ?Physical Exam ?Vitals reviewed.  ?Constitutional:   ?   General: She is not in acute distress. ?   Appearance: She is obese. She is not diaphoretic.  ?HENT:  ?   Head: Normocephalic and atraumatic.  ?   Nose: Nose normal. No congestion.  ?   Mouth/Throat:  ?   Mouth: Mucous membranes are moist.  ?   Pharynx: No posterior oropharyngeal erythema.  ?Eyes:  ?   General: No scleral icterus. ?   Extraocular Movements: Extraocular movements intact.  ?Cardiovascular:  ?   Rate and Rhythm: Normal rate and regular rhythm.  ?   Pulses: Normal pulses.  ?   Heart sounds: Normal heart sounds. No murmur heard. ?Pulmonary:  ?   Breath sounds: Normal breath sounds. No wheezing or rales.  ?Musculoskeletal:  ?   Cervical back: Neck supple. No tenderness.  ?   Right lower leg: No edema.  ?   Left lower leg: No edema.  ?Skin: ?   General: Skin is warm.  ?   Findings: No rash.  ?Neurological:  ?   General: No focal deficit present.  ?   Mental Status: She is alert and oriented to person, place, and time.  ?Psychiatric:     ?   Mood and Affect: Mood normal.     ?   Behavior: Behavior normal.  ? ? ?BP 138/86 (BP Location: Right Arm, Patient Position: Sitting, Cuff Size: Normal)   Pulse 77   Resp 18   Ht _0  (1.651 m)   Wt 225 lb 9.6 oz (102.3 kg)   SpO2 97%   BMI 37.54 kg/m?  ?Wt Readings from Last 3 Encounters:  ?11/12/21 225 lb 9.6 oz (102.3 kg)  ?06/23/21 224 lb 1.3 oz (101.6 kg)  ?12/22/20 230 lb (104.3 kg)  ? ? ?Lab Results  ?Component Value Date  ? TSH 3.020 09/29/2021  ? ?Lab Results  ?Component Value Date  ? WBC 8.4 09/29/2021  ? HGB 14.1 09/29/2021  ? HCT 41.8 09/29/2021  ? MCV 94 09/29/2021  ? PLT 240 09/29/2021  ? ?Lab Results  ?Component Value Date  ? NA 145 (H) 09/29/2021  ? K 4.1 09/29/2021  ? CO2 21 09/29/2021  ? GLUCOSE 103 (H) 09/29/2021  ? BUN  12 09/29/2021  ? CREATININE 0.98 09/29/2021  ? BILITOT 0.8 09/29/2021  ? ALKPHOS 69 09/29/2021  ? AST 31 09/29/2021  ? ALT 21 09/29/2021  ? PROT 7.1 09/29/2021  ? ALBUMIN 4.5 09/29/2021  ? CALCIUM 9.6 09/29/2021  ? ANIONGAP 13 05/17/2017  ? EGFR 65 09/29/2021  ? ?Lab Results  ?Component Value Date  ? CHOL 192 09/29/2021  ? ?  Lab Results  ?Component Value Date  ? HDL 43 09/29/2021  ? ?Lab Results  ?Component Value Date  ? LDLCALC 128 (H) 09/29/2021  ? ?Lab Results  ?Component Value Date  ? TRIG 116 09/29/2021  ? ?No results found for: CHOLHDL ?Lab Results  ?Component Value Date  ? HGBA1C 5.9 (H) 09/29/2021  ? ? ?  ?Assessment & Plan:  ? ?Problem List Items Addressed This Visit   ? ?  ? Cardiovascular and Mediastinum  ? Essential hypertension - Primary  ?  BP Readings from Last 1 Encounters:  ?11/12/21 138/86  ?Well-controlled with Olmesartan ?Counseled for compliance with the medications ?Advised DASH diet and moderate exercise/walking, at least 150 mins/week ? ?  ?  ?  ? Endocrine  ? Hypothyroidism, unspecified  ?  Lab Results  ?Component Value Date  ? TSH 3.020 09/29/2021  ?On Levothyroxine 125 mcg QD ?  ?  ? Relevant Medications  ? levothyroxine (SYNTHROID) 125 MCG tablet  ? Other Relevant Orders  ? TSH + free T4  ? Basic Metabolic Panel (BMET)  ?  ? Other  ? Morbid obesity ([place])  ?  BMI Readings from Last 3 Encounters:  ?11/12/21 37.54 kg/m?  ?06/23/21 37.29 kg/m?  ?12/22/20 38.27 kg/m?  ?With HTN, hypothyroidism and prediabetes ?Diet modification and moderate exercise/walking advised ?  ?  ? Prediabetes  ?  Lab Results  ?Component Value Date  ? HGBA1C 5.9 (H) 09/29/2021  ?Advised to continue to follow low carb diet for now ?  ?  ? Relevant Orders  ? Basic Metabolic Panel (BMET)  ? Hemoglobin A1c  ? Mixed hyperlipidemia  ?  Reviewed lipid profile, advised to follow low carb and low cholesterol diet for now ? ?  ?  ? ?Other Visit Diagnoses   ? ? Special screening for malignant neoplasms, colon      ? Relevant  Orders  ? Cologuard  ? ?  ? ? ?Meds ordered this encounter  ?Medications  ? levothyroxine (SYNTHROID) 125 MCG tablet  ?  Sig: Take 1 tablet (125 mcg total) by mouth daily.  ?  Dispense:  90 tablet  ?  Refill:  1  ?  Pl

## 2021-11-12 NOTE — Assessment & Plan Note (Signed)
Reviewed lipid profile, advised to follow low carb and low cholesterol diet for now 

## 2021-11-12 NOTE — Assessment & Plan Note (Signed)
Lab Results  ?Component Value Date  ? HGBA1C 5.9 (H) 09/29/2021  ? ?Advised to continue to follow low carb diet for now ?

## 2022-05-13 DIAGNOSIS — R7303 Prediabetes: Secondary | ICD-10-CM | POA: Diagnosis not present

## 2022-05-13 DIAGNOSIS — E039 Hypothyroidism, unspecified: Secondary | ICD-10-CM | POA: Diagnosis not present

## 2022-05-14 LAB — BASIC METABOLIC PANEL
BUN/Creatinine Ratio: 14 (ref 12–28)
BUN: 13 mg/dL (ref 8–27)
CO2: 22 mmol/L (ref 20–29)
Calcium: 9.5 mg/dL (ref 8.7–10.3)
Chloride: 101 mmol/L (ref 96–106)
Creatinine, Ser: 0.95 mg/dL (ref 0.57–1.00)
Glucose: 100 mg/dL — ABNORMAL HIGH (ref 70–99)
Potassium: 4.1 mmol/L (ref 3.5–5.2)
Sodium: 140 mmol/L (ref 134–144)
eGFR: 67 mL/min/{1.73_m2} (ref >59)

## 2022-05-14 LAB — HEMOGLOBIN A1C
Est. average glucose Bld gHb Est-mCnc: 108 mg/dL
Hgb A1c MFr Bld: 5.4 % (ref 4.8–5.6)

## 2022-05-14 LAB — TSH+FREE T4
Free T4: 1.47 ng/dL (ref 0.82–1.77)
TSH: 2.69 u[IU]/mL (ref 0.450–4.500)

## 2022-05-17 ENCOUNTER — Ambulatory Visit (INDEPENDENT_AMBULATORY_CARE_PROVIDER_SITE_OTHER): Payer: Medicare HMO | Admitting: Internal Medicine

## 2022-05-17 ENCOUNTER — Encounter: Payer: Self-pay | Admitting: Internal Medicine

## 2022-05-17 VITALS — BP 132/82 | HR 69 | Ht 65.0 in | Wt 228.2 lb

## 2022-05-17 DIAGNOSIS — Z23 Encounter for immunization: Secondary | ICD-10-CM | POA: Diagnosis not present

## 2022-05-17 DIAGNOSIS — E782 Mixed hyperlipidemia: Secondary | ICD-10-CM

## 2022-05-17 DIAGNOSIS — E559 Vitamin D deficiency, unspecified: Secondary | ICD-10-CM

## 2022-05-17 DIAGNOSIS — E039 Hypothyroidism, unspecified: Secondary | ICD-10-CM

## 2022-05-17 DIAGNOSIS — Z0001 Encounter for general adult medical examination with abnormal findings: Secondary | ICD-10-CM

## 2022-05-17 DIAGNOSIS — I10 Essential (primary) hypertension: Secondary | ICD-10-CM | POA: Diagnosis not present

## 2022-05-17 DIAGNOSIS — Z1211 Encounter for screening for malignant neoplasm of colon: Secondary | ICD-10-CM | POA: Diagnosis not present

## 2022-05-17 DIAGNOSIS — R7303 Prediabetes: Secondary | ICD-10-CM

## 2022-05-17 NOTE — Assessment & Plan Note (Signed)
Reviewed lipid profile, advised to follow low carb and low cholesterol diet for now

## 2022-05-17 NOTE — Patient Instructions (Addendum)
Please continue taking medications as prescribed.  Please continue to follow low carb diet and perform moderate exercise/walking at least 150 mins/week.  Please get fasting blood tests done before the next visit.  Please consider getting Shingrix and Tdap vaccines at your local pharmacy.

## 2022-05-17 NOTE — Assessment & Plan Note (Signed)
BMI Readings from Last 3 Encounters:  05/17/22 37.97 kg/m  11/12/21 37.54 kg/m  06/23/21 37.29 kg/m    Associated with HTN, hypothyroidism and prediabetes Diet modification and moderate exercise/walking advised

## 2022-05-17 NOTE — Assessment & Plan Note (Signed)
Lab Results  Component Value Date   TSH 2.690 05/13/2022   On Levothyroxine 125 mcg QD

## 2022-05-17 NOTE — Progress Notes (Addendum)
Established Patient Office Visit  Subjective:  Patient ID: Misty Parker, female    DOB: 1960-11-21  Age: 61 y.o. MRN: [id number]  CC:  Chief Complaint  Patient presents with   Annual Exam    HPI Misty Parker is a 61 y.o. female with past medical history of HTN and hypothyroidism who presents for annual physical.  HTN: BP is well-controlled. Takes medications regularly. Patient denies headache, dizziness, chest pain, dyspnea or palpitations.  Hypothyroidism: She is on levothyroxine currently.  She denies any recent change in weight or appetite.  She has been trying to follow low-carb diet, but has had difficulty losing weight.  She is encouraged to continue to follow Mediterranean diet and perform moderate exercise/walking as tolerated.  Past Medical History:  Diagnosis Date   Adverse effect of unspecified drugs, medicaments and biological substances, initial encounter    Allergic rhinitis due to pollen    Allergy    seasonal/pollen   Calculus of gallbladder without cholecystitis without obstruction    Chest pain, unspecified    Hyperlipidemia    Hyperlipidemia, unspecified    Hypokalemia    Hypothyroidism    Hypothyroidism, unspecified    Insomnia, unspecified    Lumbago    Nicotine dependence, unspecified, uncomplicated    Nonscarring hair loss, unspecified    Obesity, unspecified    Pain in joint involving pelvic region and thigh    Peripheral vascular disease, unspecified ([place])    Thyroid disease    Thyroiditis, unspecified     Past Surgical History:  Procedure Laterality Date   CHOLECYSTECTOMY N/A 05/20/2017   Procedure: LAPAROSCOPIC CHOLECYSTECTOMY;  Surgeon: Aviva Signs, MD;  Location: [facility];  Service: General;  Laterality: N/A;   [place] ARTHROPLASTY  2010 2011   bilateral   TUBAL LIGATION      Family History  Problem Relation Age of Onset   Hypertension Mother    Diabetes Mother    Heart disease Father    Hypertension Father      Social History   Socioeconomic History   Marital status: Divorced    Spouse name: Not on file   Number of children: Not on file   Years of education: Not on file   Highest education level: Not on file  Occupational History   Occupation: Disability    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 [employer] Use   Smoking status: Former    Years: 40.00    Types: Cigarettes    Quit date: 03/17/2017    Years since quitting: 5.1   Smokeless tobacco: Never  Vaping Use   Vaping Use: Never used  Substance and Sexual Activity   Alcohol use: No   Drug use: No   Sexual activity: Not Currently  Other Topics Concern   Not on file  Social History Narrative   Not on file   Social Determinants of Health   Financial Resource Strain: Not on file  Food Insecurity: Not on file  Transportation Needs: Not on file  Physical Activity: Not on file  Stress: Not on file  Social Connections: Not on file  Intimate Partner Violence: Not on file    Outpatient Medications Prior to Visit  Medication Sig Dispense Refill   levothyroxine (SYNTHROID) 125 MCG tablet Take 1 tablet (125 mcg total) by mouth daily. 90 tablet 1   olmesartan (BENICAR) 40 MG tablet Take 1 tablet (40 mg total) by mouth daily. 90 tablet 3   No facility-administered medications prior to visit.  No Known Allergies  ROS Review of Systems  Constitutional:  Negative for chills and fever.  HENT:  Negative for congestion, sinus pressure, sinus pain and sore throat.   Eyes:  Negative for pain and discharge.  Respiratory:  Negative for cough and shortness of breath.   Cardiovascular:  Negative for chest pain and palpitations.  Gastrointestinal:  Negative for abdominal pain, constipation, diarrhea, nausea and vomiting.  Endocrine: Negative for polydipsia and polyuria.  Genitourinary:  Negative for dysuria and hematuria.  Musculoskeletal:  Negative for neck pain and neck stiffness.  Skin:  Negative for rash.  Neurological:  Negative for  dizziness and weakness.  Psychiatric/Behavioral:  Negative for agitation and behavioral problems.       Objective:    Physical Exam Vitals reviewed.  Constitutional:      General: She is not in acute distress.    Appearance: She is obese. She is not diaphoretic.  HENT:     Head: Normocephalic and atraumatic.     Nose: Nose normal. No congestion.     Mouth/Throat:     Mouth: Mucous membranes are moist.     Pharynx: No posterior oropharyngeal erythema.  Eyes:     General: No scleral icterus.    Extraocular Movements: Extraocular movements intact.  Cardiovascular:     Rate and Rhythm: Normal rate and regular rhythm.     Pulses: Normal pulses.     Heart sounds: Normal heart sounds. No murmur heard. Pulmonary:     Breath sounds: Normal breath sounds. No wheezing or rales.  Abdominal:     Palpations: Abdomen is soft.     Tenderness: There is no abdominal tenderness.  Musculoskeletal:     Cervical back: Neck supple. No tenderness.     Right lower leg: No edema.     Left lower leg: No edema.  Skin:    General: Skin is warm.     Findings: No rash.  Neurological:     General: No focal deficit present.     Mental Status: She is alert and oriented to person, place, and time.     Cranial Nerves: No cranial nerve deficit.     Sensory: No sensory deficit.     Motor: No weakness.  Psychiatric:        Mood and Affect: Mood normal.        Behavior: Behavior normal.     BP 132/82 (BP Location: Left Arm, Cuff Size: Normal)   Pulse 69   Ht _0  (1.651 m)   Wt 228 lb 3.2 oz (103.5 kg)   SpO2 90%   BMI 37.97 kg/m  Wt Readings from Last 3 Encounters:  05/17/22 228 lb 3.2 oz (103.5 kg)  11/12/21 225 lb 9.6 oz (102.3 kg)  06/23/21 224 lb 1.3 oz (101.6 kg)    Lab Results  Component Value Date   TSH 2.690 05/13/2022   Lab Results  Component Value Date   WBC 8.4 09/29/2021   HGB 14.1 09/29/2021   HCT 41.8 09/29/2021   MCV 94 09/29/2021   PLT 240 09/29/2021   Lab Results   Component Value Date   NA 140 05/13/2022   K 4.1 05/13/2022   CO2 22 05/13/2022   GLUCOSE 100 (H) 05/13/2022   BUN 13 05/13/2022   CREATININE 0.95 05/13/2022   BILITOT 0.8 09/29/2021   ALKPHOS 69 09/29/2021   AST 31 09/29/2021   ALT 21 09/29/2021   PROT 7.1 09/29/2021   ALBUMIN 4.5 09/29/2021   CALCIUM  9.5 05/13/2022   ANIONGAP 13 05/17/2017   EGFR 67 05/13/2022   Lab Results  Component Value Date   CHOL 192 09/29/2021   Lab Results  Component Value Date   HDL 43 09/29/2021   Lab Results  Component Value Date   LDLCALC 128 (H) 09/29/2021   Lab Results  Component Value Date   TRIG 116 09/29/2021   No results found for: "CHOLHDL" Lab Results  Component Value Date   HGBA1C 5.4 05/13/2022      Assessment & Plan:   Problem List Items Addressed This Visit       Cardiovascular and Mediastinum   Essential hypertension    BP Readings from Last 1 Encounters:  05/17/22 132/82  Well-controlled with Olmesartan Counseled for compliance with the medications Advised DASH diet and moderate exercise/walking, at least 150 mins/week      Relevant Orders   CMP14+EGFR   CBC with Differential/Platelet     Endocrine   Hypothyroidism, unspecified    Lab Results  Component Value Date   TSH 2.690 05/13/2022  On Levothyroxine 125 mcg QD      Relevant Orders   TSH + free T4     Other   Morbid obesity ([place])    BMI Readings from Last 3 Encounters:  05/17/22 37.97 kg/m  11/12/21 37.54 kg/m  06/23/21 37.29 kg/m    Associated with HTN, hypothyroidism and prediabetes Diet modification and moderate exercise/walking advised      Prediabetes    Lab Results  Component Value Date   HGBA1C 5.4 05/13/2022  Advised to continue to follow low carb diet for now      Relevant Orders   Hemoglobin A1c   Mixed hyperlipidemia    Reviewed lipid profile, advised to follow low carb and low cholesterol diet for now      Relevant Orders   Lipid panel   Encounter for  general adult medical examination with abnormal findings - Primary    Physical exam as documented. Counseling done  re healthy lifestyle involving commitment to 150 minutes exercise per week, heart healthy diet, and attaining healthy weight.The importance of adequate sleep also discussed. Changes in health habits are decided on by the patient with goals and time frames  set for achieving them. Immunization and cancer screening needs are specifically addressed at this visit.      Other Visit Diagnoses     Colon cancer screening       Relevant Orders   Cologuard   Vitamin D deficiency       Relevant Orders   VITAMIN D 25 Hydroxy (Vit-D Deficiency, Fractures)       No orders of the defined types were placed in this encounter.   Follow-up: Return in about 6 months (around 11/15/2022) for HTN and hypothyroidism.    Lindell Spar, MD

## 2022-05-17 NOTE — Assessment & Plan Note (Signed)
Lab Results  Component Value Date   HGBA1C 5.4 05/13/2022   Advised to continue to follow low carb diet for now

## 2022-05-17 NOTE — Assessment & Plan Note (Signed)

## 2022-05-17 NOTE — Assessment & Plan Note (Signed)
BP Readings from Last 1 Encounters:  05/17/22 132/82   Well-controlled with Olmesartan Counseled for compliance with the medications Advised DASH diet and moderate exercise/walking, at least 150 mins/week

## 2022-06-14 ENCOUNTER — Inpatient Hospital Stay: Admission: RE | Admit: 2022-06-14 | Payer: Medicare HMO | Source: Ambulatory Visit

## 2022-09-10 ENCOUNTER — Other Ambulatory Visit: Payer: Self-pay | Admitting: Internal Medicine

## 2022-09-10 DIAGNOSIS — Z1231 Encounter for screening mammogram for malignant neoplasm of breast: Secondary | ICD-10-CM

## 2022-09-23 ENCOUNTER — Other Ambulatory Visit: Payer: Self-pay | Admitting: Internal Medicine

## 2022-09-23 DIAGNOSIS — E039 Hypothyroidism, unspecified: Secondary | ICD-10-CM

## 2022-10-01 ENCOUNTER — Telehealth: Payer: Self-pay | Admitting: Internal Medicine

## 2022-10-01 NOTE — Telephone Encounter (Signed)
Prescription Request  10/01/2022  LOV: 05/17/2022  What is the name of the medication or equipment? levothyroxine (SYNTHROID) 125 MCG tablet [id number] day supply  Have you contacted your pharmacy to request a refill? No   Which pharmacy would you like this sent to?  [place] [place] [facility] [id number] [place] [address] Phone: [phone] Fax: [phone]    Patient notified that their request is being sent to the clinical staff for review and that they should receive a response within 2 business days.   Please advise at Mobile There is no such number on file (mobile).

## 2022-10-01 NOTE — Telephone Encounter (Signed)
Refills sent on 09/23/2022

## 2022-10-18 DIAGNOSIS — Z0000 Encounter for general adult medical examination without abnormal findings: Secondary | ICD-10-CM

## 2022-10-18 NOTE — Progress Notes (Signed)
This encounter was created in error - please disregard.

## 2022-11-11 DIAGNOSIS — E039 Hypothyroidism, unspecified: Secondary | ICD-10-CM | POA: Diagnosis not present

## 2022-11-11 DIAGNOSIS — R7303 Prediabetes: Secondary | ICD-10-CM | POA: Diagnosis not present

## 2022-11-11 DIAGNOSIS — I10 Essential (primary) hypertension: Secondary | ICD-10-CM | POA: Diagnosis not present

## 2022-11-11 DIAGNOSIS — E782 Mixed hyperlipidemia: Secondary | ICD-10-CM | POA: Diagnosis not present

## 2022-11-11 DIAGNOSIS — E559 Vitamin D deficiency, unspecified: Secondary | ICD-10-CM | POA: Diagnosis not present

## 2022-11-13 LAB — CMP14+EGFR
ALT: 23 IU/L (ref 0–32)
AST: 25 IU/L (ref 0–40)
Albumin/Globulin Ratio: 1.8 (ref 1.2–2.2)
Albumin: 4.4 g/dL (ref 3.9–4.9)
Alkaline Phosphatase: 74 IU/L (ref 44–121)
BUN/Creatinine Ratio: 13 (ref 12–28)
BUN: 13 mg/dL (ref 8–27)
Bilirubin Total: 1 mg/dL (ref 0.0–1.2)
CO2: 20 mmol/L (ref 20–29)
Calcium: 9.2 mg/dL (ref 8.7–10.3)
Chloride: 103 mmol/L (ref 96–106)
Creatinine, Ser: 1.01 mg/dL — ABNORMAL HIGH (ref 0.57–1.00)
Globulin, Total: 2.4 g/dL (ref 1.5–4.5)
Glucose: 96 mg/dL (ref 70–99)
Potassium: 3.6 mmol/L (ref 3.5–5.2)
Sodium: 142 mmol/L (ref 134–144)
Total Protein: 6.8 g/dL (ref 6.0–8.5)
eGFR: 63 mL/min/{1.73_m2} (ref >59)

## 2022-11-13 LAB — CBC WITH DIFFERENTIAL/PLATELET
Basophils Absolute: 0.1 10*3/uL (ref 0.0–0.2)
Basos: 1 %
EOS (ABSOLUTE): 0.3 10*3/uL (ref 0.0–0.4)
Eos: 4 %
Hematocrit: 41.3 % (ref 34.0–46.6)
Hemoglobin: 13.9 g/dL (ref 11.1–15.9)
Immature Grans (Abs): 0.1 10*3/uL (ref 0.0–0.1)
Immature Granulocytes: 1 %
Lymphocytes Absolute: 2.6 10*3/uL (ref 0.7–3.1)
Lymphs: 32 %
MCH: 32.3 pg (ref 26.6–33.0)
MCHC: 33.7 g/dL (ref 31.5–35.7)
MCV: 96 fL (ref 79–97)
Monocytes Absolute: 0.7 10*3/uL (ref 0.1–0.9)
Monocytes: 8 %
Neutrophils Absolute: 4.4 10*3/uL (ref 1.4–7.0)
Neutrophils: 54 %
Platelets: 267 10*3/uL (ref 150–450)
RBC: 4.31 x10E6/uL (ref 3.77–5.28)
RDW: 14.9 % (ref 11.7–15.4)
WBC: 8.1 10*3/uL (ref 3.4–10.8)

## 2022-11-13 LAB — LIPID PANEL
Chol/HDL Ratio: 5.2 ratio — ABNORMAL HIGH (ref 0.0–4.4)
Cholesterol, Total: 177 mg/dL (ref 100–199)
HDL: 34 mg/dL — ABNORMAL LOW (ref >39)
LDL Chol Calc (NIH): 122 mg/dL — ABNORMAL HIGH (ref 0–99)
Triglycerides: 117 mg/dL (ref 0–149)
VLDL Cholesterol Cal: 21 mg/dL (ref 5–40)

## 2022-11-13 LAB — TSH+FREE T4
Free T4: 1.56 ng/dL (ref 0.82–1.77)
TSH: 4.61 u[IU]/mL — ABNORMAL HIGH (ref 0.450–4.500)

## 2022-11-13 LAB — HEMOGLOBIN A1C
Est. average glucose Bld gHb Est-mCnc: 114 mg/dL
Hgb A1c MFr Bld: 5.6 % (ref 4.8–5.6)

## 2022-11-13 LAB — VITAMIN D 25 HYDROXY (VIT D DEFICIENCY, FRACTURES): Vit D, 25-Hydroxy: 25.1 ng/mL — ABNORMAL LOW (ref 30.0–100.0)

## 2022-11-15 ENCOUNTER — Encounter: Payer: Self-pay | Admitting: Internal Medicine

## 2022-11-15 ENCOUNTER — Ambulatory Visit (INDEPENDENT_AMBULATORY_CARE_PROVIDER_SITE_OTHER): Payer: Medicare HMO | Admitting: Internal Medicine

## 2022-11-15 VITALS — BP 122/71 | HR 73 | Ht 65.0 in | Wt 225.0 lb

## 2022-11-15 DIAGNOSIS — L0211 Cutaneous abscess of neck: Secondary | ICD-10-CM | POA: Diagnosis not present

## 2022-11-15 DIAGNOSIS — I10 Essential (primary) hypertension: Secondary | ICD-10-CM

## 2022-11-15 DIAGNOSIS — E782 Mixed hyperlipidemia: Secondary | ICD-10-CM | POA: Diagnosis not present

## 2022-11-15 DIAGNOSIS — E538 Deficiency of other specified B group vitamins: Secondary | ICD-10-CM | POA: Diagnosis not present

## 2022-11-15 DIAGNOSIS — E039 Hypothyroidism, unspecified: Secondary | ICD-10-CM

## 2022-11-15 DIAGNOSIS — R7303 Prediabetes: Secondary | ICD-10-CM

## 2022-11-15 MED ORDER — CEPHALEXIN 500 MG PO CAPS: 500.0000 mg | ORAL_CAPSULE | Freq: Three times a day (TID) | ORAL | 0 refills | Status: DC

## 2022-11-15 MED ORDER — CYANOCOBALAMIN 1000 MCG/ML IJ SOLN
1000.0000 ug | Freq: Once | INTRAMUSCULAR | Status: AC
Start: 2022-11-15 — End: 2022-11-15
  Administered 2022-11-15: 1000 ug via INTRAMUSCULAR

## 2022-11-15 MED ORDER — LEVOTHYROXINE SODIUM 137 MCG PO TABS: 137.0000 ug | ORAL_TABLET | Freq: Every day | ORAL | 0 refills | Status: DC

## 2022-11-15 NOTE — Patient Instructions (Signed)
Please start taking Levothyroxine 137 mcg once daily instead of 125 mcg.  Please continue to take medications as prescribed.  Please continue to follow low carb diet and perform moderate exercise/walking at least 150 mins/week.  Please get blood tests done before the next visit.

## 2022-11-15 NOTE — Progress Notes (Signed)
Established Patient Office Visit  Subjective:  Patient ID: Misty Parker, female    DOB: 1961-04-21  Age: 62 y.o. MRN: [id number]  CC:  Chief Complaint  Patient presents with   Hypertension    HPI Misty Parker is a 62 y.o. female with past medical history of HTN and hypothyroidism who presents for f/u of her chronic medical conditions.  HTN: BP is well-controlled. Takes medications regularly. Patient denies headache, dizziness, chest pain, dyspnea or palpitations.  Hypothyroidism: She is on levothyroxine 125 mcg QD currently. Her TSH is elevated. She denies any recent change in weight or appetite.  She has been trying to follow low-carb diet, but has had difficulty losing weight.  She is encouraged to continue to follow Mediterranean diet and perform moderate exercise/walking as tolerated.  She reports that her bump on her posterior side of the neck for the last 1 week.  She has had mild warmth and tenderness in the area as well.  She has noticed serous discharge from the site as well.  Denies any fever or chills.  Past Medical History:  Diagnosis Date   Adverse effect of unspecified drugs, medicaments and biological substances, initial encounter    Allergic rhinitis due to pollen    Allergy    seasonal/pollen   Calculus of gallbladder without cholecystitis without obstruction    Chest pain, unspecified    Hyperlipidemia    Hyperlipidemia, unspecified    Hypokalemia    Hypothyroidism    Hypothyroidism, unspecified    Insomnia, unspecified    Lumbago    Nicotine dependence, unspecified, uncomplicated    Nonscarring hair loss, unspecified    Obesity, unspecified    Pain in joint involving pelvic region and thigh    Peripheral vascular disease, unspecified (HCC)    Thyroid disease    Thyroiditis, unspecified     Past Surgical History:  Procedure Laterality Date   CHOLECYSTECTOMY N/A 05/20/2017   Procedure: LAPAROSCOPIC CHOLECYSTECTOMY;  Surgeon: Franky Macho,  MD;  Location: [facility];  Service: General;  Laterality: N/A;   REVISION TOTAL HIP ARTHROPLASTY  2010 2011   bilateral   TUBAL LIGATION      Family History  Problem Relation Age of Onset   Hypertension Mother    Diabetes Mother    Heart disease Father    Hypertension Father     Social History   Socioeconomic History   Marital status: Divorced    Spouse name: Not on file   Number of children: Not on file   Years of education: Not on file   Highest education level: Not on file  Occupational History   Occupation: Disability    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 [employer]  Tobacco Use   Smoking status: Former    Years: 40    Types: Cigarettes    Quit date: 03/17/2017    Years since quitting: 5.6   Smokeless tobacco: Never  Vaping Use   Vaping Use: Never used  Substance and Sexual Activity   Alcohol use: No   Drug use: No   Sexual activity: Not Currently  Other Topics Concern   Not on file  Social History Narrative   Not on file   Social Determinants of Health   Financial Resource Strain: Not on file  Food Insecurity: Not on file  Transportation Needs: Not on file  Physical Activity: Not on file  Stress: Not on file  Social Connections: Not on file  Intimate Partner Violence: Not on file  Outpatient Medications Prior to Visit  Medication Sig Dispense Refill   olmesartan (BENICAR) 40 MG tablet Take 1 tablet (40 mg total) by mouth daily. 90 tablet 3   levothyroxine (SYNTHROID) 125 MCG tablet Take 1 tablet by mouth once daily 90 tablet 0   No facility-administered medications prior to visit.    No Known Allergies  ROS Review of Systems  Constitutional:  Negative for chills and fever.  HENT:  Negative for congestion, sinus pressure, sinus pain and sore throat.   Eyes:  Negative for pain and discharge.  Respiratory:  Negative for cough and shortness of breath.   Cardiovascular:  Negative for chest pain and palpitations.  Gastrointestinal:  Negative for abdominal pain,  diarrhea, nausea and vomiting.  Endocrine: Negative for polydipsia and polyuria.  Genitourinary:  Negative for dysuria and hematuria.  Musculoskeletal:  Negative for neck pain and neck stiffness.  Skin:  Negative for rash.  Neurological:  Negative for dizziness and weakness.  Psychiatric/Behavioral:  Negative for agitation and behavioral problems.       Objective:    Physical Exam Vitals reviewed.  Constitutional:      General: She is not in acute distress.    Appearance: She is obese. She is not diaphoretic.  HENT:     Head: Normocephalic and atraumatic.     Nose: Nose normal. No congestion.     Mouth/Throat:     Mouth: Mucous membranes are moist.     Pharynx: No posterior oropharyngeal erythema.  Eyes:     General: No scleral icterus.    Extraocular Movements: Extraocular movements intact.  Cardiovascular:     Rate and Rhythm: Normal rate and regular rhythm.     Pulses: Normal pulses.     Heart sounds: Normal heart sounds. No murmur heard. Pulmonary:     Breath sounds: Normal breath sounds. No wheezing or rales.  Musculoskeletal:     Cervical back: Neck supple. No tenderness.     Right lower leg: No edema.     Left lower leg: No edema.  Skin:    General: Skin is warm.     Findings: No rash.     Comments: Skin abscess over lower cervical area, about 2 cm in diameter  Neurological:     General: No focal deficit present.     Mental Status: She is alert and oriented to person, place, and time.  Psychiatric:        Mood and Affect: Mood normal.        Behavior: Behavior normal.     BP 122/71 (BP Location: Right Arm, Patient Position: Sitting, Cuff Size: Normal)   Pulse 73   Ht 5\' 5"  (1.651 m)   Wt 225 lb (102.1 kg)   SpO2 90%   BMI 37.44 kg/m  Wt Readings from Last 3 Encounters:  11/15/22 225 lb (102.1 kg)  05/17/22 228 lb 3.2 oz (103.5 kg)  11/12/21 225 lb 9.6 oz (102.3 kg)    Lab Results  Component Value Date   TSH 4.610 (H) 11/11/2022   Lab Results   Component Value Date   WBC 8.1 11/11/2022   HGB 13.9 11/11/2022   HCT 41.3 11/11/2022   MCV 96 11/11/2022   PLT 267 11/11/2022   Lab Results  Component Value Date   NA 142 11/11/2022   K 3.6 11/11/2022   CO2 20 11/11/2022   GLUCOSE 96 11/11/2022   BUN 13 11/11/2022   CREATININE 1.01 (H) 11/11/2022   BILITOT 1.0 11/11/2022  ALKPHOS 74 11/11/2022   AST 25 11/11/2022   ALT 23 11/11/2022   PROT 6.8 11/11/2022   ALBUMIN 4.4 11/11/2022   CALCIUM 9.2 11/11/2022   ANIONGAP 13 05/17/2017   EGFR 63 11/11/2022   Lab Results  Component Value Date   CHOL 177 11/11/2022   Lab Results  Component Value Date   HDL 34 (L) 11/11/2022   Lab Results  Component Value Date   LDLCALC 122 (H) 11/11/2022   Lab Results  Component Value Date   TRIG 117 11/11/2022   Lab Results  Component Value Date   CHOLHDL 5.2 (H) 11/11/2022   Lab Results  Component Value Date   HGBA1C 5.6 11/11/2022      Assessment & Plan:   Problem List Items Addressed This Visit       Cardiovascular and Mediastinum   Essential hypertension - Primary    BP Readings from Last 1 Encounters:  11/15/22 122/71  Well-controlled with Olmesartan Counseled for compliance with the medications Advised DASH diet and moderate exercise/walking, at least 150 mins/week        Endocrine   Hypothyroidism, unspecified    Lab Results  Component Value Date   TSH 4.610 (H) 11/11/2022  On Levothyroxine 125 mcg QD Increased dose of levothyroxine to 137 mcg QD Check TSH and free T4 after 3 months      Relevant Medications   levothyroxine (SYNTHROID) 137 MCG tablet   Other Relevant Orders   TSH + free T4     Musculoskeletal and Integument   Cutaneous abscess of neck    Likely folliculitis Started Keflex Advised to keep area clean and dry      Relevant Medications   cephALEXin (KEFLEX) 500 MG capsule     Other   Morbid obesity (HCC)    BMI Readings from Last 3 Encounters:  11/15/22 37.44 kg/m  05/17/22  37.97 kg/m  11/12/21 37.54 kg/m    Associated with HTN, hypothyroidism and prediabetes Diet modification and moderate exercise/walking advised      Prediabetes    Lab Results  Component Value Date   HGBA1C 5.6 11/11/2022  Advised to continue to follow low carb diet for now      Mixed hyperlipidemia    Reviewed lipid profile, advised to follow low carb and low cholesterol diet for now If persistently elevated cholesterol, can add a statin      Other Visit Diagnoses     Vitamin B12 deficiency       Relevant Medications   cyanocobalamin (VITAMIN B12) injection 1,000 mcg (Completed)   Other Relevant Orders   B12      Meds ordered this encounter  Medications   levothyroxine (SYNTHROID) 137 MCG tablet    Sig: Take 1 tablet (137 mcg total) by mouth daily.    Dispense:  90 tablet    Refill:  0    Dose change - 11/15/22   cephALEXin (KEFLEX) 500 MG capsule    Sig: Take 1 capsule (500 mg total) by mouth 3 (three) times daily.    Dispense:  15 capsule    Refill:  0   cyanocobalamin (VITAMIN B12) injection 1,000 mcg    Follow-up: Return in about 3 months (around 02/15/2023) for HTN and hypothyroidism.    Anabel Halon, MD

## 2022-11-15 NOTE — Assessment & Plan Note (Signed)
BP Readings from Last 1 Encounters:  11/15/22 122/71   Well-controlled with Olmesartan Counseled for compliance with the medications Advised DASH diet and moderate exercise/walking, at least 150 mins/week

## 2022-11-15 NOTE — Assessment & Plan Note (Addendum)
Reviewed lipid profile, advised to follow low carb and low cholesterol diet for now If persistently elevated cholesterol, can add a statin

## 2022-11-15 NOTE — Assessment & Plan Note (Signed)
Lab Results  Component Value Date   HGBA1C 5.6 11/11/2022   Advised to continue to follow low carb diet for now

## 2022-11-15 NOTE — Assessment & Plan Note (Signed)
BMI Readings from Last 3 Encounters:  11/15/22 37.44 kg/m  05/17/22 37.97 kg/m  11/12/21 37.54 kg/m    Associated with HTN, hypothyroidism and prediabetes Diet modification and moderate exercise/walking advised

## 2022-11-15 NOTE — Assessment & Plan Note (Addendum)
Lab Results  Component Value Date   TSH 4.610 (H) 11/11/2022   On Levothyroxine 125 mcg QD Increased dose of levothyroxine to 137 mcg QD Check TSH and free T4 after 3 months

## 2022-11-20 ENCOUNTER — Encounter: Payer: Self-pay | Admitting: Internal Medicine

## 2022-11-20 NOTE — Assessment & Plan Note (Addendum)
Likely folliculitis Started Keflex Advised to keep area clean and dry

## 2022-12-31 ENCOUNTER — Telehealth: Payer: Self-pay | Admitting: Internal Medicine

## 2022-12-31 NOTE — Telephone Encounter (Signed)
Prescription Request  12/31/2022  LOV: 11/15/2022  What is the name of the medication or equipment? olmesartan (BENICAR) 40 MG tablet [[id number]]   Have you contacted your pharmacy to request a refill? No   Which pharmacy would you like this sent to?  [facility] [address] [place] - [address] [place] [address] Phone: [phone] Fax: [phone]    Patient notified that their request is being sent to the clinical staff for review and that they should receive a response within 2 business days.   Please advise at Mobile There is no such number on file (mobile).

## 2022-12-31 NOTE — Telephone Encounter (Signed)
Patient needs to contact pharmacy for refills , 90 supply with refills sent on 11/29/2022. Can not advise patient due to no number

## 2023-01-03 ENCOUNTER — Telehealth: Payer: Self-pay | Admitting: Internal Medicine

## 2023-01-03 ENCOUNTER — Other Ambulatory Visit: Payer: Self-pay

## 2023-01-03 DIAGNOSIS — I159 Secondary hypertension, unspecified: Secondary | ICD-10-CM

## 2023-01-03 MED ORDER — OLMESARTAN MEDOXOMIL 40 MG PO TABS
40.0000 mg | ORAL_TABLET | Freq: Every day | ORAL | 3 refills | Status: DC
Start: 2023-01-03 — End: 2024-03-27

## 2023-01-03 NOTE — Telephone Encounter (Signed)
Refill sent.

## 2023-01-03 NOTE — Telephone Encounter (Signed)
Prescription Request  01/03/2023  LOV: 11/15/2022  What is the name of the medication or equipment?   olmesartan (BENICAR) 40 MG tablet **Last pill taken Thursday night, 12/30/22**  Have you contacted your pharmacy to request a refill? Yes   Which pharmacy would you like this sent to?  [facility] [address] [place] - [address] [place] [address] Phone: [phone] Fax: [phone]    Patient notified that their request is being sent to the clinical staff for review and that they should receive a response within 2 business days.   Please advise patient when refill sent in at [phone].

## 2023-02-11 DIAGNOSIS — E039 Hypothyroidism, unspecified: Secondary | ICD-10-CM | POA: Diagnosis not present

## 2023-02-11 DIAGNOSIS — E538 Deficiency of other specified B group vitamins: Secondary | ICD-10-CM | POA: Diagnosis not present

## 2023-02-15 ENCOUNTER — Encounter: Payer: Self-pay | Admitting: Internal Medicine

## 2023-02-15 ENCOUNTER — Ambulatory Visit (INDEPENDENT_AMBULATORY_CARE_PROVIDER_SITE_OTHER): Payer: Medicare HMO | Admitting: Internal Medicine

## 2023-02-15 VITALS — BP 136/70 | HR 69 | Ht 65.0 in | Wt 225.6 lb

## 2023-02-15 DIAGNOSIS — M7989 Other specified soft tissue disorders: Secondary | ICD-10-CM

## 2023-02-15 DIAGNOSIS — R7303 Prediabetes: Secondary | ICD-10-CM | POA: Diagnosis not present

## 2023-02-15 DIAGNOSIS — Z1211 Encounter for screening for malignant neoplasm of colon: Secondary | ICD-10-CM

## 2023-02-15 DIAGNOSIS — E039 Hypothyroidism, unspecified: Secondary | ICD-10-CM | POA: Diagnosis not present

## 2023-02-15 DIAGNOSIS — I10 Essential (primary) hypertension: Secondary | ICD-10-CM | POA: Diagnosis not present

## 2023-02-15 DIAGNOSIS — E782 Mixed hyperlipidemia: Secondary | ICD-10-CM

## 2023-02-15 MED ORDER — FUROSEMIDE 20 MG PO TABS
20.0000 mg | ORAL_TABLET | Freq: Every day | ORAL | 0 refills | Status: DC | PRN
Start: 2023-02-15 — End: 2023-07-07

## 2023-02-15 NOTE — Progress Notes (Signed)
Established Patient Office Visit  Subjective:  Patient ID: Misty Parker, female    DOB: 1961-01-10  Age: 62 y.o. MRN: [id number]  CC:  Chief Complaint  Patient presents with   Hypertension    Three month follow up    Hypothyroidism    Three month follow up    Foot Swelling    Feet and ankle swelling     HPI Misty Parker is a 62 y.o. female with past medical history of HTN and hypothyroidism who presents for f/u of her chronic medical conditions.  HTN: BP is well-controlled. Takes medications regularly. Patient denies headache, dizziness, chest pain, dyspnea or palpitations.  She reports bilateral foot and ankle swelling, which is chronic.  She reports mild improvement with leg elevation.  Hypothyroidism: She is on levothyroxine 137 mcg QD currently. Her TSH was elevated in 05/24 and her dose of levothyroxine was increased to 137 mcg from 125 mcg. She denies any recent change in weight or appetite.  She has been trying to follow low-carb diet, but has had difficulty losing weight.  She is encouraged to continue to follow Mediterranean diet and perform moderate exercise/walking as tolerated.  Past Medical History:  Diagnosis Date   Adverse effect of unspecified drugs, medicaments and biological substances, initial encounter    Allergic rhinitis due to pollen    Allergy    seasonal/pollen   Calculus of gallbladder without cholecystitis without obstruction    Chest pain, unspecified    Hyperlipidemia    Hyperlipidemia, unspecified    Hypokalemia    Hypothyroidism    Hypothyroidism, unspecified    Insomnia, unspecified    Lumbago    Nicotine dependence, unspecified, uncomplicated    Nonscarring hair loss, unspecified    Obesity, unspecified    Pain in joint involving pelvic region and thigh    Peripheral vascular disease, unspecified (HCC)    Thyroid disease    Thyroiditis, unspecified     Past Surgical History:  Procedure Laterality Date   CHOLECYSTECTOMY  N/A 05/20/2017   Procedure: LAPAROSCOPIC CHOLECYSTECTOMY;  Surgeon: Franky Macho, MD;  Location: [facility];  Service: General;  Laterality: N/A;   REVISION TOTAL HIP ARTHROPLASTY  2010 2011   bilateral   TUBAL LIGATION      Family History  Problem Relation Age of Onset   Hypertension Mother    Diabetes Mother    Heart disease Father    Hypertension Father     Social History   Socioeconomic History   Marital status: Divorced    Spouse name: Not on file   Number of children: Not on file   Years of education: Not on file   Highest education level: Not on file  Occupational History   Occupation: Disability    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 [employer]  Tobacco Use   Smoking status: Former    Current packs/day: 0.00    Types: Cigarettes    Start date: 03/17/1977    Quit date: 03/17/2017    Years since quitting: 5.9   Smokeless tobacco: Never  Vaping Use   Vaping status: Never Used  Substance and Sexual Activity   Alcohol use: No   Drug use: No   Sexual activity: Not Currently  Other Topics Concern   Not on file  Social History Narrative   Not on file   Social Determinants of Health   Financial Resource Strain: Not on file  Food Insecurity: Not on file  Transportation Needs: Not on file  Physical  Activity: Not on file  Stress: Not on file  Social Connections: Not on file  Intimate Partner Violence: Not on file    Outpatient Medications Prior to Visit  Medication Sig Dispense Refill   levothyroxine (SYNTHROID) 137 MCG tablet Take 1 tablet (137 mcg total) by mouth daily. 90 tablet 0   olmesartan (BENICAR) 40 MG tablet Take 1 tablet (40 mg total) by mouth daily. 90 tablet 3   cephALEXin (KEFLEX) 500 MG capsule Take 1 capsule (500 mg total) by mouth 3 (three) times daily. 15 capsule 0   No facility-administered medications prior to visit.    No Known Allergies  ROS Review of Systems  Constitutional:  Negative for chills and fever.  HENT:  Negative for congestion, sinus  pressure, sinus pain and sore throat.   Eyes:  Negative for pain and discharge.  Respiratory:  Negative for cough and shortness of breath.   Cardiovascular:  Positive for leg swelling. Negative for chest pain and palpitations.  Gastrointestinal:  Negative for abdominal pain, diarrhea, nausea and vomiting.  Endocrine: Negative for polydipsia and polyuria.  Genitourinary:  Negative for dysuria and hematuria.  Musculoskeletal:  Negative for neck pain and neck stiffness.  Skin:  Negative for rash.  Neurological:  Negative for dizziness and weakness.  Psychiatric/Behavioral:  Negative for agitation and behavioral problems.       Objective:    Physical Exam Vitals reviewed.  Constitutional:      General: She is not in acute distress.    Appearance: She is obese. She is not diaphoretic.  HENT:     Head: Normocephalic and atraumatic.     Nose: Nose normal. No congestion.     Mouth/Throat:     Mouth: Mucous membranes are moist.     Pharynx: No posterior oropharyngeal erythema.  Eyes:     General: No scleral icterus.    Extraocular Movements: Extraocular movements intact.  Cardiovascular:     Rate and Rhythm: Normal rate and regular rhythm.     Pulses: Normal pulses.     Heart sounds: Normal heart sounds. No murmur heard. Pulmonary:     Breath sounds: Normal breath sounds. No wheezing or rales.  Musculoskeletal:     Cervical back: Neck supple. No tenderness.     Right lower leg: Edema (1+) present.     Left lower leg: Edema (1+) present.  Skin:    General: Skin is warm.     Findings: No rash.  Neurological:     General: No focal deficit present.     Mental Status: She is alert and oriented to person, place, and time.  Psychiatric:        Mood and Affect: Mood normal.        Behavior: Behavior normal.     BP 136/70 (BP Location: Right Arm)   Pulse 69   Ht 5\' 5"  (1.651 m)   Wt 225 lb 9.6 oz (102.3 kg)   SpO2 90%   BMI 37.54 kg/m  Wt Readings from Last 3 Encounters:   02/15/23 225 lb 9.6 oz (102.3 kg)  11/15/22 225 lb (102.1 kg)  05/17/22 228 lb 3.2 oz (103.5 kg)    Lab Results  Component Value Date   TSH 0.186 (L) 02/11/2023   Lab Results  Component Value Date   WBC 8.1 11/11/2022   HGB 13.9 11/11/2022   HCT 41.3 11/11/2022   MCV 96 11/11/2022   PLT 267 11/11/2022   Lab Results  Component Value Date  NA 142 11/11/2022   K 3.6 11/11/2022   CO2 20 11/11/2022   GLUCOSE 96 11/11/2022   BUN 13 11/11/2022   CREATININE 1.01 (H) 11/11/2022   BILITOT 1.0 11/11/2022   ALKPHOS 74 11/11/2022   AST 25 11/11/2022   ALT 23 11/11/2022   PROT 6.8 11/11/2022   ALBUMIN 4.4 11/11/2022   CALCIUM 9.2 11/11/2022   ANIONGAP 13 05/17/2017   EGFR 63 11/11/2022   Lab Results  Component Value Date   CHOL 177 11/11/2022   Lab Results  Component Value Date   HDL 34 (L) 11/11/2022   Lab Results  Component Value Date   LDLCALC 122 (H) 11/11/2022   Lab Results  Component Value Date   TRIG 117 11/11/2022   Lab Results  Component Value Date   CHOLHDL 5.2 (H) 11/11/2022   Lab Results  Component Value Date   HGBA1C 5.6 11/11/2022      Assessment & Plan:   Problem List Items Addressed This Visit       Cardiovascular and Mediastinum   Essential hypertension    BP Readings from Last 1 Encounters:  02/15/23 136/70   Well-controlled with Olmesartan Counseled for compliance with the medications Advised DASH diet and moderate exercise/walking, at least 150 mins/week      Relevant Medications   furosemide (LASIX) 20 MG tablet   Other Relevant Orders   CMP14+EGFR   CBC with Differential/Platelet     Endocrine   Hypothyroidism, unspecified - Primary    Lab Results  Component Value Date   TSH 0.186 (L) 02/11/2023   Increased dose of levothyroxine to 137 mcg QD in the last visit due to high TSH Has low TSH with wnl free T4 now - continue same dose of Levothyroxine for now Check TSH and free T4 after 3 months      Relevant Orders    TSH + free T4     Other   Prediabetes    Lab Results  Component Value Date   HGBA1C 5.6 11/11/2022   Advised to continue to follow low carb diet for now      Relevant Orders   Hemoglobin A1c   CMP14+EGFR   Mixed hyperlipidemia    Reviewed lipid profile, advised to follow low carb and low cholesterol diet for now If persistently elevated cholesterol, can add a statin      Relevant Medications   furosemide (LASIX) 20 MG tablet   Other Relevant Orders   Lipid panel   Leg swelling    Likely due to chronic venous insufficiency Advised to follow low salt diet, perform leg elevation and try compression socks If persistent leg swelling, added Lasix as needed      Relevant Medications   furosemide (LASIX) 20 MG tablet   Colon cancer screening    Discussed about colonoscopy and cologuard - benefits of each procedure discussed. Patient prefers Cologuard - ordered.      Relevant Orders   Cologuard    Meds ordered this encounter  Medications   furosemide (LASIX) 20 MG tablet    Sig: Take 1 tablet (20 mg total) by mouth daily as needed for fluid or edema.    Dispense:  30 tablet    Refill:  0    Follow-up: Return in about 3 months (around 05/18/2023) for Annual physical (after 05/18/23).    Anabel Halon, MD

## 2023-02-15 NOTE — Patient Instructions (Signed)
Please take Lasix as needed for leg swelling.  Please perform leg elevation and apply compression socks for leg swelling.  Please continue to take medications as prescribed.  Please continue to follow low salt diet and perform moderate exercise/walking at least 150 mins/week.  Please get fasting blood tests done before the next visit.

## 2023-02-15 NOTE — Assessment & Plan Note (Signed)
Lab Results  Component Value Date   TSH 0.186 (L) 02/11/2023   Increased dose of levothyroxine to 137 mcg QD in the last visit due to high TSH Has low TSH with wnl free T4 now - continue same dose of Levothyroxine for now Check TSH and free T4 after 3 months

## 2023-02-15 NOTE — Assessment & Plan Note (Addendum)
BP Readings from Last 1 Encounters:  02/15/23 136/70   Well-controlled with Olmesartan Counseled for compliance with the medications Advised DASH diet and moderate exercise/walking, at least 150 mins/week

## 2023-02-18 DIAGNOSIS — M7989 Other specified soft tissue disorders: Secondary | ICD-10-CM | POA: Insufficient documentation

## 2023-02-18 DIAGNOSIS — Z1211 Encounter for screening for malignant neoplasm of colon: Secondary | ICD-10-CM | POA: Insufficient documentation

## 2023-02-18 NOTE — Assessment & Plan Note (Signed)
Reviewed lipid profile, advised to follow low carb and low cholesterol diet for now If persistently elevated cholesterol, can add a statin

## 2023-02-18 NOTE — Assessment & Plan Note (Signed)
Lab Results  Component Value Date   HGBA1C 5.6 11/11/2022   Advised to continue to follow low carb diet for now

## 2023-02-18 NOTE — Assessment & Plan Note (Signed)
Likely due to chronic venous insufficiency Advised to follow low salt diet, perform leg elevation and try compression socks If persistent leg swelling, added Lasix as needed

## 2023-02-18 NOTE — Assessment & Plan Note (Signed)
Discussed about colonoscopy and cologuard - benefits of each procedure discussed. Patient prefers Cologuard - ordered.

## 2023-02-23 ENCOUNTER — Ambulatory Visit (HOSPITAL_COMMUNITY)
Admission: RE | Admit: 2023-02-23 | Discharge: 2023-02-23 | Disposition: A | Discharge status: Home / Self Care | Payer: Medicare HMO | Source: Ambulatory Visit | Attending: Internal Medicine | Admitting: Internal Medicine

## 2023-02-23 DIAGNOSIS — Z1231 Encounter for screening mammogram for malignant neoplasm of breast: Secondary | ICD-10-CM | POA: Insufficient documentation

## 2023-02-25 ENCOUNTER — Other Ambulatory Visit: Payer: Self-pay | Admitting: Internal Medicine

## 2023-02-25 DIAGNOSIS — E039 Hypothyroidism, unspecified: Secondary | ICD-10-CM

## 2023-05-16 DIAGNOSIS — E039 Hypothyroidism, unspecified: Secondary | ICD-10-CM | POA: Diagnosis not present

## 2023-05-16 DIAGNOSIS — E782 Mixed hyperlipidemia: Secondary | ICD-10-CM | POA: Diagnosis not present

## 2023-05-16 DIAGNOSIS — R7303 Prediabetes: Secondary | ICD-10-CM | POA: Diagnosis not present

## 2023-05-16 DIAGNOSIS — I10 Essential (primary) hypertension: Secondary | ICD-10-CM | POA: Diagnosis not present

## 2023-05-17 LAB — LIPID PANEL
Chol/HDL Ratio: 4.8 {ratio} — ABNORMAL HIGH (ref 0.0–4.4)
Cholesterol, Total: 203 mg/dL — ABNORMAL HIGH (ref 100–199)
HDL: 42 mg/dL (ref >39)
LDL Chol Calc (NIH): 135 mg/dL — ABNORMAL HIGH (ref 0–99)
Triglycerides: 142 mg/dL (ref 0–149)
VLDL Cholesterol Cal: 26 mg/dL (ref 5–40)

## 2023-05-17 LAB — CBC WITH DIFFERENTIAL/PLATELET
Basophils Absolute: 0 10*3/uL (ref 0.0–0.2)
Basos: 1 %
EOS (ABSOLUTE): 0.3 10*3/uL (ref 0.0–0.4)
Eos: 4 %
Hematocrit: 42.9 % (ref 34.0–46.6)
Hemoglobin: 14.2 g/dL (ref 11.1–15.9)
Immature Grans (Abs): 0 10*3/uL (ref 0.0–0.1)
Immature Granulocytes: 0 %
Lymphocytes Absolute: 2.7 10*3/uL (ref 0.7–3.1)
Lymphs: 36 %
MCH: 32.4 pg (ref 26.6–33.0)
MCHC: 33.1 g/dL (ref 31.5–35.7)
MCV: 98 fL — ABNORMAL HIGH (ref 79–97)
Monocytes Absolute: 0.5 10*3/uL (ref 0.1–0.9)
Monocytes: 7 %
Neutrophils Absolute: 3.9 10*3/uL (ref 1.4–7.0)
Neutrophils: 52 %
Platelets: 225 10*3/uL (ref 150–450)
RBC: 4.38 x10E6/uL (ref 3.77–5.28)
RDW: 15.1 % (ref 11.7–15.4)
WBC: 7.4 10*3/uL (ref 3.4–10.8)

## 2023-05-17 LAB — TSH+FREE T4
Free T4: 1.41 ng/dL (ref 0.82–1.77)
TSH: 0.712 u[IU]/mL (ref 0.450–4.500)

## 2023-05-17 LAB — CMP14+EGFR
ALT: 29 [IU]/L (ref 0–32)
AST: 32 [IU]/L (ref 0–40)
Albumin: 4.3 g/dL (ref 3.9–4.9)
Alkaline Phosphatase: 65 [IU]/L (ref 44–121)
BUN/Creatinine Ratio: 16 (ref 12–28)
BUN: 15 mg/dL (ref 8–27)
Bilirubin Total: 0.6 mg/dL (ref 0.0–1.2)
CO2: 20 mmol/L (ref 20–29)
Calcium: 9.6 mg/dL (ref 8.7–10.3)
Chloride: 103 mmol/L (ref 96–106)
Creatinine, Ser: 0.91 mg/dL (ref 0.57–1.00)
Globulin, Total: 2 g/dL (ref 1.5–4.5)
Glucose: 107 mg/dL — ABNORMAL HIGH (ref 70–99)
Potassium: 3.7 mmol/L (ref 3.5–5.2)
Sodium: 143 mmol/L (ref 134–144)
Total Protein: 6.3 g/dL (ref 6.0–8.5)
eGFR: 70 mL/min/{1.73_m2} (ref >59)

## 2023-05-17 LAB — HEMOGLOBIN A1C
Est. average glucose Bld gHb Est-mCnc: 120 mg/dL
Hgb A1c MFr Bld: 5.8 % — ABNORMAL HIGH (ref 4.8–5.6)

## 2023-05-18 ENCOUNTER — Encounter: Payer: Self-pay | Admitting: Internal Medicine

## 2023-05-18 ENCOUNTER — Ambulatory Visit (INDEPENDENT_AMBULATORY_CARE_PROVIDER_SITE_OTHER): Payer: Medicare HMO | Admitting: Internal Medicine

## 2023-05-18 VITALS — BP 100/63 | HR 76 | Ht 65.0 in | Wt 226.0 lb

## 2023-05-18 DIAGNOSIS — R7303 Prediabetes: Secondary | ICD-10-CM

## 2023-05-18 DIAGNOSIS — I10 Essential (primary) hypertension: Secondary | ICD-10-CM

## 2023-05-18 DIAGNOSIS — E039 Hypothyroidism, unspecified: Secondary | ICD-10-CM | POA: Diagnosis not present

## 2023-05-18 DIAGNOSIS — Z23 Encounter for immunization: Secondary | ICD-10-CM | POA: Diagnosis not present

## 2023-05-18 DIAGNOSIS — Z0001 Encounter for general adult medical examination with abnormal findings: Secondary | ICD-10-CM

## 2023-05-18 DIAGNOSIS — E782 Mixed hyperlipidemia: Secondary | ICD-10-CM

## 2023-05-18 MED ORDER — LEVOTHYROXINE SODIUM 137 MCG PO TABS: 137.0000 ug | ORAL_TABLET | Freq: Every day | ORAL | 1 refills | Status: DC

## 2023-05-18 NOTE — Assessment & Plan Note (Signed)

## 2023-05-18 NOTE — Assessment & Plan Note (Signed)
Lab Results  Component Value Date   TSH 0.712 05/16/2023   Increased dose of levothyroxine to 137 mcg QD in the last visit due to high TSH Has TSH and free T4 wnl now - continue same dose of Levothyroxine for now Check TSH and free T4 after 6 months

## 2023-05-18 NOTE — Assessment & Plan Note (Signed)
BMI Readings from Last 3 Encounters:  05/18/23 37.61 kg/m  02/15/23 37.54 kg/m  11/15/22 37.44 kg/m    Associated with HTN, hypothyroidism and prediabetes Diet modification and moderate exercise/walking advised

## 2023-05-18 NOTE — Assessment & Plan Note (Signed)
BP Readings from Last 1 Encounters:  05/18/23 100/63   Well-controlled with Olmesartan Counseled for compliance with the medications Advised DASH diet and moderate exercise/walking, at least 150 mins/week

## 2023-05-18 NOTE — Assessment & Plan Note (Signed)
Reviewed lipid profile, advised to follow low carb and low cholesterol diet for now If persistently elevated cholesterol, can add a statin - ASCVD < 10 currently

## 2023-05-18 NOTE — Assessment & Plan Note (Signed)
Lab Results  Component Value Date   HGBA1C 5.8 (H) 05/16/2023   Advised to continue to follow low carb diet for now

## 2023-05-18 NOTE — Progress Notes (Signed)
Established Patient Office Visit  Subjective:  Patient ID: Misty Parker, female    DOB: 1961/04/12  Age: 62 y.o. MRN: [id number]  CC:  Chief Complaint  Patient presents with   Annual Exam    HPI [name] is a 62 y.o. female with past medical history of HTN and hypothyroidism who presents for annual physical.  HTN: BP is well-controlled. Takes medications regularly. Patient denies headache, dizziness, chest pain, dyspnea or palpitations.  She reports bilateral foot and ankle swelling, which is chronic.  She reports mild improvement with leg elevation.   Hypothyroidism: She is on levothyroxine 137 mcg QD currently.  Her TSH and free T4 are WNL now.  She denies any recent change in weight or appetite.   She has been trying to follow low-carb diet, but has had difficulty losing weight.  She is encouraged to continue to follow Mediterranean diet and perform moderate exercise/walking as tolerated.      Past Medical History:  Diagnosis Date   Adverse effect of unspecified drugs, medicaments and biological substances, initial encounter    Allergic rhinitis due to pollen    Allergy    seasonal/pollen   Calculus of gallbladder without cholecystitis without obstruction    Chest pain, unspecified    Hyperlipidemia    Hyperlipidemia, unspecified    Hypokalemia    Hypothyroidism    Hypothyroidism, unspecified    Insomnia, unspecified    Lumbago    Nicotine dependence, unspecified, uncomplicated    Nonscarring hair loss, unspecified    Obesity, unspecified    Pain in joint involving pelvic region and thigh    Peripheral vascular disease, unspecified (HCC)    Thyroid disease    Thyroiditis, unspecified     Past Surgical History:  Procedure Laterality Date   CHOLECYSTECTOMY N/A 05/20/2017   Procedure: LAPAROSCOPIC CHOLECYSTECTOMY;  Surgeon: Franky Macho, MD;  Location: [facility];  Service: General;  Laterality: N/A;   REVISION TOTAL HIP ARTHROPLASTY  2010 2011    bilateral   TUBAL LIGATION      Family History  Problem Relation Age of Onset   Hypertension Mother    Diabetes Mother    Heart disease Father    Hypertension Father     Social History   Socioeconomic History   Marital status: Divorced    Spouse name: Not on file   Number of children: Not on file   Years of education: Not on file   Highest education level: Not on file  Occupational History   Occupation: Disability    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 [employer]  Tobacco Use   Smoking status: Former    Current packs/day: 0.00    Types: Cigarettes    Start date: 03/17/1977    Quit date: 03/17/2017    Years since quitting: 6.1   Smokeless tobacco: Never  Vaping Use   Vaping status: Never Used  Substance and Sexual Activity   Alcohol use: No   Drug use: No   Sexual activity: Not Currently  Other Topics Concern   Not on file  Social History Narrative   Not on file   Social Determinants of Health   Financial Resource Strain: Not on file  Food Insecurity: Not on file  Transportation Needs: Not on file  Physical Activity: Not on file  Stress: Not on file  Social Connections: Not on file  Intimate Partner Violence: Not on file    Outpatient Medications Prior to Visit  Medication Sig Dispense Refill  furosemide (LASIX) 20 MG tablet Take 1 tablet (20 mg total) by mouth daily as needed for fluid or edema. 30 tablet 0   olmesartan (BENICAR) 40 MG tablet Take 1 tablet (40 mg total) by mouth daily. 90 tablet 3   levothyroxine (SYNTHROID) 137 MCG tablet Take 1 tablet by mouth once daily 90 tablet 0   No facility-administered medications prior to visit.    No Known Allergies  ROS Review of Systems  Constitutional:  Negative for chills and fever.  HENT:  Negative for congestion, sinus pressure, sinus pain and sore throat.   Eyes:  Negative for pain and discharge.  Respiratory:  Negative for cough and shortness of breath.   Cardiovascular:  Positive for leg swelling. Negative for  chest pain and palpitations.  Gastrointestinal:  Negative for abdominal pain, diarrhea, nausea and vomiting.  Endocrine: Negative for polydipsia and polyuria.  Genitourinary:  Negative for dysuria and hematuria.  Musculoskeletal:  Negative for neck pain and neck stiffness.  Skin:  Negative for rash.  Neurological:  Negative for dizziness and weakness.  Psychiatric/Behavioral:  Negative for agitation and behavioral problems.       Objective:    Physical Exam Vitals reviewed.  Constitutional:      General: She is not in acute distress.    Appearance: She is obese. She is not diaphoretic.  HENT:     Head: Normocephalic and atraumatic.     Nose: Nose normal. No congestion.     Mouth/Throat:     Mouth: Mucous membranes are moist.     Pharynx: No posterior oropharyngeal erythema.  Eyes:     General: No scleral icterus.    Extraocular Movements: Extraocular movements intact.  Cardiovascular:     Rate and Rhythm: Normal rate and regular rhythm.     Pulses: Normal pulses.     Heart sounds: Normal heart sounds. No murmur heard. Pulmonary:     Breath sounds: Normal breath sounds. No wheezing or rales.  Abdominal:     Palpations: Abdomen is soft.     Tenderness: There is no abdominal tenderness.  Musculoskeletal:     Cervical back: Neck supple. No tenderness.     Right lower leg: Edema (Trace) present.     Left lower leg: Edema (Trace) present.  Skin:    General: Skin is warm.     Findings: No rash.  Neurological:     General: No focal deficit present.     Mental Status: She is alert and oriented to person, place, and time.     Cranial Nerves: No cranial nerve deficit.     Sensory: No sensory deficit.     Motor: No weakness.  Psychiatric:        Mood and Affect: Mood normal.        Behavior: Behavior normal.     BP 100/63 (BP Location: Right Arm, Patient Position: Sitting, Cuff Size: Large)   Pulse 76   Ht 5\' 5"  (1.651 m)   Wt 226 lb (102.5 kg)   SpO2 94%   BMI 37.61  kg/m  Wt Readings from Last 3 Encounters:  05/18/23 226 lb (102.5 kg)  02/15/23 225 lb 9.6 oz (102.3 kg)  11/15/22 225 lb (102.1 kg)    Lab Results  Component Value Date   TSH 0.712 05/16/2023   Lab Results  Component Value Date   WBC 7.4 05/16/2023   HGB 14.2 05/16/2023   HCT 42.9 05/16/2023   MCV 98 (H) 05/16/2023   PLT 225  05/16/2023   Lab Results  Component Value Date   NA 143 05/16/2023   K 3.7 05/16/2023   CO2 20 05/16/2023   GLUCOSE 107 (H) 05/16/2023   BUN 15 05/16/2023   CREATININE 0.91 05/16/2023   BILITOT 0.6 05/16/2023   ALKPHOS 65 05/16/2023   AST 32 05/16/2023   ALT 29 05/16/2023   PROT 6.3 05/16/2023   ALBUMIN 4.3 05/16/2023   CALCIUM 9.6 05/16/2023   ANIONGAP 13 05/17/2017   EGFR 70 05/16/2023   Lab Results  Component Value Date   CHOL 203 (H) 05/16/2023   Lab Results  Component Value Date   HDL 42 05/16/2023   Lab Results  Component Value Date   LDLCALC 135 (H) 05/16/2023   Lab Results  Component Value Date   TRIG 142 05/16/2023   Lab Results  Component Value Date   CHOLHDL 4.8 (H) 05/16/2023   Lab Results  Component Value Date   HGBA1C 5.8 (H) 05/16/2023      Assessment & Plan:   Problem List Items Addressed This Visit       Cardiovascular and Mediastinum   Essential hypertension    BP Readings from Last 1 Encounters:  05/18/23 100/63   Well-controlled with Olmesartan Counseled for compliance with the medications Advised DASH diet and moderate exercise/walking, at least 150 mins/week        Endocrine   Hypothyroidism, unspecified    Lab Results  Component Value Date   TSH 0.712 05/16/2023   Increased dose of levothyroxine to 137 mcg QD in the last visit due to high TSH Has TSH and free T4 wnl now - continue same dose of Levothyroxine for now Check TSH and free T4 after 6 months      Relevant Medications   levothyroxine (SYNTHROID) 137 MCG tablet   Other Relevant Orders   TSH + free T4     Other   Morbid  obesity (HCC)    BMI Readings from Last 3 Encounters:  05/18/23 37.61 kg/m  02/15/23 37.54 kg/m  11/15/22 37.44 kg/m    Associated with HTN, hypothyroidism and prediabetes Diet modification and moderate exercise/walking advised      Prediabetes    Lab Results  Component Value Date   HGBA1C 5.8 (H) 05/16/2023   Advised to continue to follow low carb diet for now      Relevant Orders   CMP14+EGFR   Hemoglobin A1c   Mixed hyperlipidemia    Reviewed lipid profile, advised to follow low carb and low cholesterol diet for now If persistently elevated cholesterol, can add a statin - ASCVD < 10 currently      Relevant Orders   Lipid Profile   Encounter for general adult medical examination with abnormal findings - Primary    Physical exam as documented. Counseling done  re healthy lifestyle involving commitment to 150 minutes exercise per week, heart healthy diet, and attaining healthy weight.The importance of adequate sleep also discussed. Changes in health habits are decided on by the patient with goals and time frames  set for achieving them. Immunization and cancer screening needs are specifically addressed at this visit.      Other Visit Diagnoses     Encounter for immunization       Relevant Orders   Flu vaccine trivalent PF, 6mos and older(Flulaval,Afluria,Fluarix,Fluzone) (Completed)       Meds ordered this encounter  Medications   levothyroxine (SYNTHROID) 137 MCG tablet    Sig: Take 1 tablet (137 mcg total)  by mouth daily.    Dispense:  90 tablet    Refill:  1    Follow-up: Return in about 6 months (around 11/15/2023) for HTN and hypothyroidism.    Anabel Halon, MD

## 2023-05-18 NOTE — Patient Instructions (Signed)
Please continue to take medications as prescribed.  Please continue to follow low carb diet and perform moderate exercise/walking at least 150 mins/week.  Please get fasting blood tests done before the next visit. 

## 2023-06-01 ENCOUNTER — Ambulatory Visit (INDEPENDENT_AMBULATORY_CARE_PROVIDER_SITE_OTHER): Payer: Medicare HMO | Admitting: Family Medicine

## 2023-06-01 ENCOUNTER — Other Ambulatory Visit (HOSPITAL_COMMUNITY)
Admission: RE | Admit: 2023-06-01 | Discharge: 2023-06-01 | Disposition: A | Discharge status: Home / Self Care | Payer: Medicare HMO | Source: Ambulatory Visit | Attending: Family Medicine | Admitting: Family Medicine

## 2023-06-01 ENCOUNTER — Encounter: Payer: Self-pay | Admitting: Family Medicine

## 2023-06-01 VITALS — BP 134/82 | HR 71 | Ht 65.0 in | Wt 226.1 lb

## 2023-06-01 DIAGNOSIS — Z124 Encounter for screening for malignant neoplasm of cervix: Secondary | ICD-10-CM | POA: Diagnosis not present

## 2023-06-01 DIAGNOSIS — Z01419 Encounter for gynecological examination (general) (routine) without abnormal findings: Secondary | ICD-10-CM | POA: Diagnosis not present

## 2023-06-01 DIAGNOSIS — Z1151 Encounter for screening for human papillomavirus (HPV): Secondary | ICD-10-CM | POA: Diagnosis not present

## 2023-06-01 NOTE — Patient Instructions (Addendum)
I appreciate the opportunity to provide care to you today!    Follow up: PCP     Attached with your AVS, you will find valuable resources for self-education. I highly recommend dedicating some time to thoroughly examine them.   Please continue to a heart-healthy diet and increase your physical activities. Try to exercise for at least five days a week.    It was a pleasure to see you and I look forward to continuing to work together on your health and well-being. Please do not hesitate to call the office if you need care or have questions about your care.  In case of emergency, please visit the Emergency Department for urgent care, or contact our clinic at [phone] to schedule an appointment. We're here to help you!   Have a wonderful day and week. With Gratitude, Gilmore Laroche MSN, FNP-BC

## 2023-06-01 NOTE — Assessment & Plan Note (Signed)
Pap completed Pending results

## 2023-06-01 NOTE — Progress Notes (Signed)
Acute Office Visit  Subjective:    Patient ID: Misty Parker, female    DOB: 1960-11-17, 62 y.o.   MRN: [id number]  Chief Complaint  Patient presents with   Gynecologic Exam    Pap today    HPI Patient is in today for Pap smear examination with no complaints.   Past Medical History:  Diagnosis Date   Adverse effect of unspecified drugs, medicaments and biological substances, initial encounter    Allergic rhinitis due to pollen    Allergy    seasonal/pollen   Calculus of gallbladder without cholecystitis without obstruction    Chest pain, unspecified    Hyperlipidemia    Hyperlipidemia, unspecified    Hypokalemia    Hypothyroidism    Hypothyroidism, unspecified    Insomnia, unspecified    Lumbago    Nicotine dependence, unspecified, uncomplicated    Nonscarring hair loss, unspecified    Obesity, unspecified    Pain in joint involving pelvic region and thigh    Peripheral vascular disease, unspecified (HCC)    Thyroid disease    Thyroiditis, unspecified     Past Surgical History:  Procedure Laterality Date   CHOLECYSTECTOMY N/A 05/20/2017   Procedure: LAPAROSCOPIC CHOLECYSTECTOMY;  Surgeon: Franky Macho, MD;  Location: [facility];  Service: General;  Laterality: N/A;   REVISION TOTAL HIP ARTHROPLASTY  2010 2011   bilateral   TUBAL LIGATION      Family History  Problem Relation Age of Onset   Hypertension Mother    Diabetes Mother    Heart disease Father    Hypertension Father     Social History   Socioeconomic History   Marital status: Divorced    Spouse name: Not on file   Number of children: Not on file   Years of education: Not on file   Highest education level: Not on file  Occupational History   Occupation: Disability    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 [employer]  Tobacco Use   Smoking status: Former    Current packs/day: 0.00    Types: Cigarettes    Start date: 03/17/1977    Quit date: 03/17/2017    Years since quitting: 6.2   Smokeless tobacco: Never   Vaping Use   Vaping status: Never Used  Substance and Sexual Activity   Alcohol use: No   Drug use: No   Sexual activity: Not Currently  Other Topics Concern   Not on file  Social History Narrative   Not on file   Social Determinants of Health   Financial Resource Strain: Not on file  Food Insecurity: Not on file  Transportation Needs: Not on file  Physical Activity: Not on file  Stress: Not on file  Social Connections: Not on file  Intimate Partner Violence: Not on file    Outpatient Medications Prior to Visit  Medication Sig Dispense Refill   furosemide (LASIX) 20 MG tablet Take 1 tablet (20 mg total) by mouth daily as needed for fluid or edema. 30 tablet 0   levothyroxine (SYNTHROID) 137 MCG tablet Take 1 tablet (137 mcg total) by mouth daily. 90 tablet 1   olmesartan (BENICAR) 40 MG tablet Take 1 tablet (40 mg total) by mouth daily. 90 tablet 3   No facility-administered medications prior to visit.    No Known Allergies  Review of Systems  Constitutional:  Negative for chills and fever.  Eyes:  Negative for visual disturbance.  Respiratory:  Negative for chest tightness and shortness of breath.  Genitourinary:  Negative for decreased urine volume, hematuria, menstrual problem, vaginal bleeding, vaginal discharge and vaginal pain.  Neurological:  Negative for dizziness and headaches.       Objective:    Physical Exam HENT:     Head: Normocephalic.     Mouth/Throat:     Mouth: Mucous membranes are moist.  Cardiovascular:     Rate and Rhythm: Normal rate.     Heart sounds: Normal heart sounds.  Pulmonary:     Effort: Pulmonary effort is normal.     Breath sounds: Normal breath sounds.  Genitourinary:    Comments: Vaginal wall: pink and rugated, smooth and non-tender; absence of lesions, edema, and erythema. Labia Majora and Minora: present bilaterally, moist, soft tissue, and homogeneous; free of edema and ulcerations. Clitoris is anatomically present,  above the urethral, and free of lesions, masses, and ulceration.     Neurological:     Mental Status: She is alert.     BP 134/82   Pulse 71   Ht 5\' 5"  (1.651 m)   Wt 226 lb 1.9 oz (102.6 kg)   SpO2 92%   BMI 37.63 kg/m  Wt Readings from Last 3 Encounters:  06/01/23 226 lb 1.9 oz (102.6 kg)  05/18/23 226 lb (102.5 kg)  02/15/23 225 lb 9.6 oz (102.3 kg)       Assessment & Plan:  Cervical cancer screening Assessment & Plan: Pap completed Pending results  Orders: -     Cytology - PAP  Note: This chart has been completed using Engineer, civil (consulting) software, and while attempts have been made to ensure accuracy, certain words and phrases may not be transcribed as intended.    Gilmore Laroche, FNP

## 2023-06-07 LAB — CYTOLOGY - PAP
Adequacy: ABSENT
Comment: NEGATIVE
Diagnosis: NEGATIVE
High risk HPV: NEGATIVE

## 2023-06-07 NOTE — Progress Notes (Signed)
Please inform the patient that her Pap examination was negative for intraepithelial lesion or malignancy this means that there were no signs of precancerous changes or cancer were found in the cervical cells sampled during the exam; this is a normal result indicating the cervical cells are healthy and there are no indications of cervical cancer or precancer conditions.

## 2023-07-07 ENCOUNTER — Other Ambulatory Visit: Payer: Self-pay | Admitting: Internal Medicine

## 2023-07-07 DIAGNOSIS — M7989 Other specified soft tissue disorders: Secondary | ICD-10-CM

## 2023-08-11 ENCOUNTER — Other Ambulatory Visit: Payer: Self-pay | Admitting: Internal Medicine

## 2023-08-11 DIAGNOSIS — M7989 Other specified soft tissue disorders: Secondary | ICD-10-CM

## 2023-10-25 ENCOUNTER — Ambulatory Visit: Payer: Medicare HMO

## 2023-11-11 DIAGNOSIS — E782 Mixed hyperlipidemia: Secondary | ICD-10-CM | POA: Diagnosis not present

## 2023-11-11 DIAGNOSIS — R7303 Prediabetes: Secondary | ICD-10-CM | POA: Diagnosis not present

## 2023-11-11 DIAGNOSIS — E039 Hypothyroidism, unspecified: Secondary | ICD-10-CM | POA: Diagnosis not present

## 2023-11-12 LAB — CMP14+EGFR
ALT: 24 IU/L (ref 0–32)
AST: 22 IU/L (ref 0–40)
Albumin: 4.1 g/dL (ref 3.9–4.9)
Alkaline Phosphatase: 69 IU/L (ref 44–121)
BUN/Creatinine Ratio: 12 (ref 12–28)
BUN: 12 mg/dL (ref 8–27)
Bilirubin Total: 0.7 mg/dL (ref 0.0–1.2)
CO2: 20 mmol/L (ref 20–29)
Calcium: 9 mg/dL (ref 8.7–10.3)
Chloride: 107 mmol/L — ABNORMAL HIGH (ref 96–106)
Creatinine, Ser: 1.03 mg/dL — ABNORMAL HIGH (ref 0.57–1.00)
Globulin, Total: 2.3 g/dL (ref 1.5–4.5)
Glucose: 99 mg/dL (ref 70–99)
Potassium: 4 mmol/L (ref 3.5–5.2)
Sodium: 142 mmol/L (ref 134–144)
Total Protein: 6.4 g/dL (ref 6.0–8.5)
eGFR: 61 mL/min/{1.73_m2} (ref >59)

## 2023-11-12 LAB — LIPID PANEL
Chol/HDL Ratio: 4.4 ratio (ref 0.0–4.4)
Cholesterol, Total: 193 mg/dL (ref 100–199)
HDL: 44 mg/dL (ref >39)
LDL Chol Calc (NIH): 123 mg/dL — ABNORMAL HIGH (ref 0–99)
Triglycerides: 144 mg/dL (ref 0–149)
VLDL Cholesterol Cal: 26 mg/dL (ref 5–40)

## 2023-11-12 LAB — HEMOGLOBIN A1C
Est. average glucose Bld gHb Est-mCnc: 123 mg/dL
Hgb A1c MFr Bld: 5.9 % — ABNORMAL HIGH (ref 4.8–5.6)

## 2023-11-12 LAB — TSH+FREE T4
Free T4: 1.53 ng/dL (ref 0.82–1.77)
TSH: 0.11 u[IU]/mL — ABNORMAL LOW (ref 0.450–4.500)

## 2023-11-15 ENCOUNTER — Encounter: Payer: Self-pay | Admitting: Internal Medicine

## 2023-11-15 ENCOUNTER — Ambulatory Visit (INDEPENDENT_AMBULATORY_CARE_PROVIDER_SITE_OTHER): Payer: Self-pay | Admitting: Internal Medicine

## 2023-11-15 VITALS — BP 138/84 | HR 76 | Ht 65.0 in | Wt 231.2 lb

## 2023-11-15 DIAGNOSIS — R7303 Prediabetes: Secondary | ICD-10-CM | POA: Diagnosis not present

## 2023-11-15 DIAGNOSIS — E782 Mixed hyperlipidemia: Secondary | ICD-10-CM | POA: Diagnosis not present

## 2023-11-15 DIAGNOSIS — L03115 Cellulitis of right lower limb: Secondary | ICD-10-CM | POA: Diagnosis not present

## 2023-11-15 DIAGNOSIS — I10 Essential (primary) hypertension: Secondary | ICD-10-CM

## 2023-11-15 DIAGNOSIS — R42 Dizziness and giddiness: Secondary | ICD-10-CM | POA: Insufficient documentation

## 2023-11-15 DIAGNOSIS — E039 Hypothyroidism, unspecified: Secondary | ICD-10-CM | POA: Diagnosis not present

## 2023-11-15 DIAGNOSIS — M7989 Other specified soft tissue disorders: Secondary | ICD-10-CM | POA: Diagnosis not present

## 2023-11-15 MED ORDER — MECLIZINE HCL 25 MG PO TABS: 25.0000 mg | ORAL_TABLET | Freq: Two times a day (BID) | ORAL | 0 refills | Status: AC | PRN

## 2023-11-15 MED ORDER — DOXYCYCLINE HYCLATE 100 MG PO TABS: 100.0000 mg | ORAL_TABLET | Freq: Two times a day (BID) | ORAL | 0 refills | Status: AC

## 2023-11-15 MED ORDER — LEVOTHYROXINE SODIUM 125 MCG PO TABS: 125.0000 ug | ORAL_TABLET | Freq: Every day | ORAL | 1 refills | Status: AC

## 2023-11-15 NOTE — Assessment & Plan Note (Signed)
 Dizziness likely due to vertigo Advised to perform vestibular exercises Meclizine as needed for dizziness Maintain adequate hydration and eat at regular intervals Avoid sudden positional changes

## 2023-11-15 NOTE — Assessment & Plan Note (Signed)
 Right heel area cellulitis after a tick bite Prescribed doxycycline for cellulitis Keep area clean and dry, can apply bandage over the area If she has nonhealing ulcer, may need wound care and vascular evaluation

## 2023-11-15 NOTE — Patient Instructions (Addendum)
 Please schedule Medicare Annual Wellness.  Please start taking Levothyroxine  125 mcg instead of 137 mcg.  Please take Meclizine as needed for dizziness.  Please start taking Doxycycline for leg cellulitis.

## 2023-11-15 NOTE — Assessment & Plan Note (Signed)
 BP Readings from Last 1 Encounters:  11/15/23 138/84   Well-controlled with Olmesartan  40 mg QD Counseled for compliance with the medications Advised DASH diet and moderate exercise/walking, at least 150 mins/week

## 2023-11-15 NOTE — Assessment & Plan Note (Signed)
 Reviewed lipid profile, advised to follow low carb and low cholesterol diet for now If persistently elevated cholesterol, can add a statin - ASCVD < 10 currently

## 2023-11-15 NOTE — Assessment & Plan Note (Signed)
 Lab Results  Component Value Date   HGBA1C 5.9 (H) 11/11/2023   Advised to continue to follow low carb diet for now

## 2023-11-15 NOTE — Assessment & Plan Note (Addendum)
 Lab Results  Component Value Date   TSH 0.110 (L) 11/11/2023   Has low TSH and free T4 wnl now - decreased dose of Levothyroxine  to 125 mcg QD now Check TSH and free T4 after 6 months

## 2023-11-15 NOTE — Progress Notes (Signed)
 Established Patient Office Visit  Subjective:  Patient ID: Misty Parker, female    DOB: [birth year]  Age: 63 y.o. MRN: [id number]  CC:  Chief Complaint  Patient presents with   Medical Management of Chronic Issues    6 month f/u    HPI Misty Parker is a 63 y.o. female with past medical history of HTN and hypothyroidism who presents for f/u of her chronic medical conditions.  HTN: BP is well-controlled. Takes medications regularly. Patient denies headache, chest pain, dyspnea or palpitations.  She reports bilateral foot and ankle swelling, which is chronic.  She reports mild improvement with leg elevation.   Hypothyroidism: She is on levothyroxine  137 mcg QD currently.  Her TSH is low now and free T4 are WNL now.  She denies any recent change in appetite.  She is more worried about weight gain currently.  She has chronic diarrhea since her cholecystectomy.   She has been trying to follow low-carb diet, but has had difficulty losing weight.  She is encouraged to continue to follow Mediterranean diet and perform moderate exercise/walking as tolerated.   She reports septic bite on her right heel area about 2 months ago, and has had a skin scab since then.  She has been trying to keep it covered, but when the scab falls, she has bleeding and small ulcer.  Denies any puslike discharge, fever or chills.  She also reports intermittent dizzy spells, especially with changing position.  She has had room spinning sensation while turning in her bed as well.  Denies any nausea or vomiting currently.   Past Medical History:  Diagnosis Date   Adverse effect of unspecified drugs, medicaments and biological substances, initial encounter    Allergic rhinitis due to pollen    Allergy    seasonal/pollen   Calculus of gallbladder without cholecystitis without obstruction    Chest pain, unspecified    Hyperlipidemia    Hyperlipidemia, unspecified    Hypokalemia    Hypothyroidism     Hypothyroidism, unspecified    Insomnia, unspecified    Lumbago    Nicotine dependence, unspecified, uncomplicated    Nonscarring hair loss, unspecified    Obesity, unspecified    Pain in joint involving pelvic region and thigh    Peripheral vascular disease, unspecified (HCC)    Thyroid  disease    Thyroiditis, unspecified     Past Surgical History:  Procedure Laterality Date   CHOLECYSTECTOMY N/A 05/20/2017   Procedure: LAPAROSCOPIC CHOLECYSTECTOMY;  Surgeon: Alanda Allegra, MD;  Location: [facility];  Service: General;  Laterality: N/A;   REVISION TOTAL HIP ARTHROPLASTY  2010 2011   bilateral   TUBAL LIGATION      Family History  Problem Relation Age of Onset   Hypertension Mother    Diabetes Mother    Heart disease Father    Hypertension Father     Social History   Socioeconomic History   Marital status: Divorced    Spouse name: Not on file   Number of children: Not on file   Years of education: Not on file   Highest education level: Not on file  Occupational History   Occupation: Disability    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 [employer]  Tobacco Use   Smoking status: Former    Current packs/day: 0.00    Types: Cigarettes    Start date: 03/17/1977    Quit date: 03/17/2017    Years since quitting: 6.6   Smokeless tobacco: Never  Vaping Use  Vaping status: Never Used  Substance and Sexual Activity   Alcohol use: No   Drug use: No   Sexual activity: Not Currently  Other Topics Concern   Not on file  Social History Narrative   Not on file   Social Drivers of Health   Financial Resource Strain: Not on file  Food Insecurity: Not on file  Transportation Needs: Not on file  Physical Activity: Not on file  Stress: Not on file  Social Connections: Not on file  Intimate Partner Violence: Not on file    Outpatient Medications Prior to Visit  Medication Sig Dispense Refill   furosemide  (LASIX ) 20 MG tablet TAKE 1 TABLET BY MOUTH ONCE DAILY AS NEEDED FOR FLUID OR EDEMA 30  tablet 0   olmesartan  (BENICAR ) 40 MG tablet Take 1 tablet (40 mg total) by mouth daily. 90 tablet 3   levothyroxine  (SYNTHROID ) 137 MCG tablet Take 1 tablet (137 mcg total) by mouth daily. 90 tablet 1   No facility-administered medications prior to visit.    No Known Allergies  ROS Review of Systems  Constitutional:  Negative for chills and fever.  HENT:  Negative for congestion, sinus pressure, sinus pain and sore throat.   Eyes:  Negative for pain and discharge.  Respiratory:  Negative for cough and shortness of breath.   Cardiovascular:  Positive for leg swelling. Negative for chest pain and palpitations.  Gastrointestinal:  Negative for abdominal pain, diarrhea, nausea and vomiting.  Endocrine: Negative for polydipsia and polyuria.  Genitourinary:  Negative for dysuria and hematuria.  Musculoskeletal:  Negative for neck pain and neck stiffness.  Skin:  Negative for rash.  Neurological:  Positive for dizziness. Negative for weakness.  Psychiatric/Behavioral:  Negative for agitation and behavioral problems.       Objective:    Physical Exam Vitals reviewed.  Constitutional:      General: She is not in acute distress.    Appearance: She is obese. She is not diaphoretic.  HENT:     Head: Normocephalic and atraumatic.     Nose: Nose normal. No congestion.     Mouth/Throat:     Mouth: Mucous membranes are moist.     Pharynx: No posterior oropharyngeal erythema.  Eyes:     General: No scleral icterus.    Extraocular Movements: Extraocular movements intact.  Cardiovascular:     Rate and Rhythm: Normal rate and regular rhythm.     Heart sounds: Normal heart sounds. No murmur heard. Pulmonary:     Breath sounds: Normal breath sounds. No wheezing or rales.  Musculoskeletal:     Cervical back: Neck supple. No tenderness.     Right lower leg: Edema (Trace) present.     Left lower leg: Edema (Trace) present.  Skin:    General: Skin is warm.     Findings: Lesion (About 1 cm  in diameter ulcerated lesion over right heel area) present. No rash.  Neurological:     General: No focal deficit present.     Mental Status: She is alert and oriented to person, place, and time.     Sensory: No sensory deficit.     Motor: No weakness.  Psychiatric:        Mood and Affect: Mood normal.        Behavior: Behavior normal.     BP 138/84 (BP Location: Left Arm)   Pulse 76   Ht 5\' 5"  (1.651 m)   Wt 231 lb 3.2 oz (104.9 kg)  SpO2 93%   BMI 38.47 kg/m  Wt Readings from Last 3 Encounters:  11/15/23 231 lb 3.2 oz (104.9 kg)  06/01/23 226 lb 1.9 oz (102.6 kg)  05/18/23 226 lb (102.5 kg)    Lab Results  Component Value Date   TSH 0.110 (L) 11/11/2023   Lab Results  Component Value Date   WBC 7.4 05/16/2023   HGB 14.2 05/16/2023   HCT 42.9 05/16/2023   MCV 98 (H) 05/16/2023   PLT 225 05/16/2023   Lab Results  Component Value Date   NA 142 11/11/2023   K 4.0 11/11/2023   CO2 20 11/11/2023   GLUCOSE 99 11/11/2023   BUN 12 11/11/2023   CREATININE 1.03 (H) 11/11/2023   BILITOT 0.7 11/11/2023   ALKPHOS 69 11/11/2023   AST 22 11/11/2023   ALT 24 11/11/2023   PROT 6.4 11/11/2023   ALBUMIN 4.1 11/11/2023   CALCIUM 9.0 11/11/2023   ANIONGAP 13 05/17/2017   EGFR 61 11/11/2023   Lab Results  Component Value Date   CHOL 193 11/11/2023   Lab Results  Component Value Date   HDL 44 11/11/2023   Lab Results  Component Value Date   LDLCALC 123 (H) 11/11/2023   Lab Results  Component Value Date   TRIG 144 11/11/2023   Lab Results  Component Value Date   CHOLHDL 4.4 11/11/2023   Lab Results  Component Value Date   HGBA1C 5.9 (H) 11/11/2023      Assessment & Plan:   Problem List Items Addressed This Visit       Cardiovascular and Mediastinum   Essential hypertension - Primary   BP Readings from Last 1 Encounters:  11/15/23 138/84   Well-controlled with Olmesartan  40 mg QD Counseled for compliance with the medications Advised DASH diet and  moderate exercise/walking, at least 150 mins/week      Relevant Orders   CMP14+EGFR   CBC with Differential/Platelet     Endocrine   Hypothyroidism, unspecified   Lab Results  Component Value Date   TSH 0.110 (L) 11/11/2023   Has low TSH and free T4 wnl now - decreased dose of Levothyroxine  to 125 mcg QD now Check TSH and free T4 after 6 months      Relevant Medications   levothyroxine  (SYNTHROID ) 125 MCG tablet   Other Relevant Orders   TSH + free T4     Other   Prediabetes   Lab Results  Component Value Date   HGBA1C 5.9 (H) 11/11/2023   Advised to continue to follow low carb diet for now      Relevant Orders   Hemoglobin A1c   Mixed hyperlipidemia   Reviewed lipid profile, advised to follow low carb and low cholesterol diet for now If persistently elevated cholesterol, can add a statin - ASCVD < 10 currently      Relevant Orders   Lipid Profile   Leg swelling   Likely due to chronic venous insufficiency Advised to follow low salt diet, perform leg elevation and try compression socks Had persistent leg swelling, has Lasix  as needed now      Vertigo   Dizziness likely due to vertigo Advised to perform vestibular exercises Meclizine as needed for dizziness Maintain adequate hydration and eat at regular intervals Avoid sudden positional changes      Relevant Medications   meclizine (ANTIVERT) 25 MG tablet   Cellulitis of right lower extremity   Right heel area cellulitis after a tick bite Prescribed doxycycline for cellulitis  Keep area clean and dry, can apply bandage over the area If she has nonhealing ulcer, may need wound care and vascular evaluation      Relevant Medications   doxycycline (VIBRA-TABS) 100 MG tablet     Meds ordered this encounter  Medications   doxycycline (VIBRA-TABS) 100 MG tablet    Sig: Take 1 tablet (100 mg total) by mouth 2 (two) times daily.    Dispense:  14 tablet    Refill:  0   meclizine (ANTIVERT) 25 MG tablet     Sig: Take 1 tablet (25 mg total) by mouth 2 (two) times daily as needed for dizziness.    Dispense:  30 tablet    Refill:  0   levothyroxine  (SYNTHROID ) 125 MCG tablet    Sig: Take 1 tablet (125 mcg total) by mouth daily.    Dispense:  90 tablet    Refill:  1    Follow-up: Return in about 6 months (around 05/17/2024), or if symptoms worsen or fail to improve, for Annual physical (after 05/17/24).    Meldon Sport, MD

## 2023-11-15 NOTE — Assessment & Plan Note (Signed)
 Likely due to chronic venous insufficiency Advised to follow low salt diet, perform leg elevation and try compression socks Had persistent leg swelling, has Lasix  as needed now

## 2024-03-27 ENCOUNTER — Other Ambulatory Visit: Payer: Self-pay | Admitting: Internal Medicine

## 2024-03-27 DIAGNOSIS — I159 Secondary hypertension, unspecified: Secondary | ICD-10-CM

## 2024-04-25 ENCOUNTER — Encounter

## 2024-05-21 DIAGNOSIS — E039 Hypothyroidism, unspecified: Secondary | ICD-10-CM | POA: Diagnosis not present

## 2024-05-21 DIAGNOSIS — E782 Mixed hyperlipidemia: Secondary | ICD-10-CM | POA: Diagnosis not present

## 2024-05-21 DIAGNOSIS — I10 Essential (primary) hypertension: Secondary | ICD-10-CM | POA: Diagnosis not present

## 2024-05-21 DIAGNOSIS — R7303 Prediabetes: Secondary | ICD-10-CM | POA: Diagnosis not present

## 2024-05-22 ENCOUNTER — Encounter: Admitting: Internal Medicine

## 2024-05-22 LAB — CMP14+EGFR
ALT: 27 IU/L (ref 0–32)
AST: 29 IU/L (ref 0–40)
Albumin: 4.8 g/dL (ref 3.9–4.9)
Alkaline Phosphatase: 69 IU/L (ref 49–135)
BUN/Creatinine Ratio: 13 (ref 12–28)
BUN: 18 mg/dL (ref 8–27)
Bilirubin Total: 1 mg/dL (ref 0.0–1.2)
CO2: 20 mmol/L (ref 20–29)
Calcium: 9.7 mg/dL (ref 8.7–10.3)
Chloride: 103 mmol/L (ref 96–106)
Creatinine, Ser: 1.34 mg/dL — ABNORMAL HIGH (ref 0.57–1.00)
Globulin, Total: 2.9 g/dL (ref 1.5–4.5)
Glucose: 95 mg/dL (ref 70–99)
Potassium: 3.8 mmol/L (ref 3.5–5.2)
Sodium: 142 mmol/L (ref 134–144)
Total Protein: 7.7 g/dL (ref 6.0–8.5)
eGFR: 44 mL/min/1.73 — ABNORMAL LOW (ref >59)

## 2024-05-22 LAB — CBC WITH DIFFERENTIAL/PLATELET
Basophils Absolute: 0.1 x10E3/uL (ref 0.0–0.2)
Basos: 1 %
EOS (ABSOLUTE): 0.2 x10E3/uL (ref 0.0–0.4)
Eos: 2 %
Hematocrit: 40.8 % (ref 34.0–46.6)
Hemoglobin: 13.5 g/dL (ref 11.1–15.9)
Immature Grans (Abs): 0 x10E3/uL (ref 0.0–0.1)
Immature Granulocytes: 0 %
Lymphocytes Absolute: 3.1 x10E3/uL (ref 0.7–3.1)
Lymphs: 41 %
MCH: 32.8 pg (ref 26.6–33.0)
MCHC: 33.1 g/dL (ref 31.5–35.7)
MCV: 99 fL — ABNORMAL HIGH (ref 79–97)
Monocytes Absolute: 0.6 x10E3/uL (ref 0.1–0.9)
Monocytes: 9 %
Neutrophils Absolute: 3.5 x10E3/uL (ref 1.4–7.0)
Neutrophils: 46 %
Platelets: 184 x10E3/uL (ref 150–450)
RBC: 4.12 x10E6/uL (ref 3.77–5.28)
RDW: 15 % (ref 11.7–15.4)
WBC: 7.5 x10E3/uL (ref 3.4–10.8)

## 2024-05-22 LAB — HEMOGLOBIN A1C
Est. average glucose Bld gHb Est-mCnc: 108 mg/dL
Hgb A1c MFr Bld: 5.4 % (ref 4.8–5.6)

## 2024-05-22 LAB — LIPID PANEL
Chol/HDL Ratio: 4.7 ratio — ABNORMAL HIGH (ref 0.0–4.4)
Cholesterol, Total: 245 mg/dL — ABNORMAL HIGH (ref 100–199)
HDL: 52 mg/dL (ref >39)
LDL Chol Calc (NIH): 170 mg/dL — ABNORMAL HIGH (ref 0–99)
Triglycerides: 128 mg/dL (ref 0–149)
VLDL Cholesterol Cal: 23 mg/dL (ref 5–40)

## 2024-05-22 LAB — TSH+FREE T4
Free T4: 1.19 ng/dL (ref 0.82–1.77)
TSH: 56.2 u[IU]/mL — ABNORMAL HIGH (ref 0.450–4.500)
